# Patient Record
Sex: Female | Born: 1970 | Race: Black or African American | Marital: Single | Smoking: Never smoker
Health system: Southern US, Community
[De-identification: ages and names within clinical notes are randomized; demographics above are authoritative.]

## PROBLEM LIST (undated history)

## (undated) DIAGNOSIS — H409 Unspecified glaucoma: Secondary | ICD-10-CM

## (undated) DIAGNOSIS — E662 Morbid (severe) obesity with alveolar hypoventilation: Secondary | ICD-10-CM

## (undated) DIAGNOSIS — E039 Hypothyroidism, unspecified: Secondary | ICD-10-CM

## (undated) DIAGNOSIS — D869 Sarcoidosis, unspecified: Secondary | ICD-10-CM

## (undated) DIAGNOSIS — E232 Diabetes insipidus: Secondary | ICD-10-CM

## (undated) DIAGNOSIS — M810 Age-related osteoporosis without current pathological fracture: Secondary | ICD-10-CM

## (undated) DIAGNOSIS — I272 Pulmonary hypertension, unspecified: Secondary | ICD-10-CM

## (undated) DIAGNOSIS — I509 Heart failure, unspecified: Secondary | ICD-10-CM

## (undated) DIAGNOSIS — E230 Hypopituitarism: Secondary | ICD-10-CM

## (undated) DIAGNOSIS — G4733 Obstructive sleep apnea (adult) (pediatric): Secondary | ICD-10-CM

## (undated) DIAGNOSIS — K219 Gastro-esophageal reflux disease without esophagitis: Secondary | ICD-10-CM

## (undated) DIAGNOSIS — I10 Essential (primary) hypertension: Secondary | ICD-10-CM

## (undated) HISTORY — PX: ABDOMINAL HYSTERECTOMY: SHX81

## (undated) HISTORY — PX: BRAIN BIOPSY: SHX905

## (undated) HISTORY — PX: HERNIA REPAIR: SHX51

---

## 2019-04-05 ENCOUNTER — Encounter (HOSPITAL_COMMUNITY): Payer: Self-pay | Admitting: Family Medicine

## 2019-04-05 ENCOUNTER — Inpatient Hospital Stay (HOSPITAL_COMMUNITY)
Admission: AD | Admit: 2019-04-05 | Discharge: 2019-05-06 | DRG: 870 | Disposition: E | Payer: Medicaid Other | Source: Other Acute Inpatient Hospital | Attending: Internal Medicine | Admitting: Internal Medicine

## 2019-04-05 ENCOUNTER — Inpatient Hospital Stay (HOSPITAL_COMMUNITY): Payer: Medicaid Other

## 2019-04-05 DIAGNOSIS — R0602 Shortness of breath: Secondary | ICD-10-CM

## 2019-04-05 DIAGNOSIS — I2781 Cor pulmonale (chronic): Secondary | ICD-10-CM | POA: Diagnosis not present

## 2019-04-05 DIAGNOSIS — X58XXXA Exposure to other specified factors, initial encounter: Secondary | ICD-10-CM | POA: Diagnosis not present

## 2019-04-05 DIAGNOSIS — I5030 Unspecified diastolic (congestive) heart failure: Secondary | ICD-10-CM | POA: Diagnosis present

## 2019-04-05 DIAGNOSIS — R0902 Hypoxemia: Secondary | ICD-10-CM

## 2019-04-05 DIAGNOSIS — I110 Hypertensive heart disease with heart failure: Secondary | ICD-10-CM | POA: Diagnosis present

## 2019-04-05 DIAGNOSIS — L8990 Pressure ulcer of unspecified site, unspecified stage: Secondary | ICD-10-CM | POA: Insufficient documentation

## 2019-04-05 DIAGNOSIS — R6521 Severe sepsis with septic shock: Secondary | ICD-10-CM | POA: Diagnosis present

## 2019-04-05 DIAGNOSIS — Z809 Family history of malignant neoplasm, unspecified: Secondary | ICD-10-CM

## 2019-04-05 DIAGNOSIS — E039 Hypothyroidism, unspecified: Secondary | ICD-10-CM | POA: Diagnosis present

## 2019-04-05 DIAGNOSIS — R579 Shock, unspecified: Secondary | ICD-10-CM | POA: Diagnosis not present

## 2019-04-05 DIAGNOSIS — Z7952 Long term (current) use of systemic steroids: Secondary | ICD-10-CM

## 2019-04-05 DIAGNOSIS — Z825 Family history of asthma and other chronic lower respiratory diseases: Secondary | ICD-10-CM

## 2019-04-05 DIAGNOSIS — Z515 Encounter for palliative care: Secondary | ICD-10-CM | POA: Diagnosis not present

## 2019-04-05 DIAGNOSIS — D860 Sarcoidosis of lung: Secondary | ICD-10-CM | POA: Diagnosis present

## 2019-04-05 DIAGNOSIS — Z66 Do not resuscitate: Secondary | ICD-10-CM | POA: Diagnosis not present

## 2019-04-05 DIAGNOSIS — I272 Pulmonary hypertension, unspecified: Secondary | ICD-10-CM | POA: Diagnosis present

## 2019-04-05 DIAGNOSIS — J9611 Chronic respiratory failure with hypoxia: Secondary | ICD-10-CM | POA: Diagnosis present

## 2019-04-05 DIAGNOSIS — J9621 Acute and chronic respiratory failure with hypoxia: Secondary | ICD-10-CM | POA: Diagnosis not present

## 2019-04-05 DIAGNOSIS — Z0189 Encounter for other specified special examinations: Secondary | ICD-10-CM

## 2019-04-05 DIAGNOSIS — G931 Anoxic brain damage, not elsewhere classified: Secondary | ICD-10-CM | POA: Diagnosis present

## 2019-04-05 DIAGNOSIS — Z87891 Personal history of nicotine dependence: Secondary | ICD-10-CM

## 2019-04-05 DIAGNOSIS — J15212 Pneumonia due to Methicillin resistant Staphylococcus aureus: Secondary | ICD-10-CM | POA: Diagnosis present

## 2019-04-05 DIAGNOSIS — R001 Bradycardia, unspecified: Secondary | ICD-10-CM | POA: Diagnosis not present

## 2019-04-05 DIAGNOSIS — J1289 Other viral pneumonia: Secondary | ICD-10-CM | POA: Diagnosis present

## 2019-04-05 DIAGNOSIS — G92 Toxic encephalopathy: Secondary | ICD-10-CM | POA: Diagnosis present

## 2019-04-05 DIAGNOSIS — Z833 Family history of diabetes mellitus: Secondary | ICD-10-CM

## 2019-04-05 DIAGNOSIS — A4102 Sepsis due to Methicillin resistant Staphylococcus aureus: Principal | ICD-10-CM | POA: Diagnosis present

## 2019-04-05 DIAGNOSIS — U071 COVID-19: Secondary | ICD-10-CM | POA: Diagnosis present

## 2019-04-05 DIAGNOSIS — J988 Other specified respiratory disorders: Secondary | ICD-10-CM

## 2019-04-05 DIAGNOSIS — D8689 Sarcoidosis of other sites: Secondary | ICD-10-CM | POA: Diagnosis present

## 2019-04-05 DIAGNOSIS — Z90710 Acquired absence of both cervix and uterus: Secondary | ICD-10-CM

## 2019-04-05 DIAGNOSIS — J9601 Acute respiratory failure with hypoxia: Secondary | ICD-10-CM | POA: Diagnosis not present

## 2019-04-05 DIAGNOSIS — J690 Pneumonitis due to inhalation of food and vomit: Secondary | ICD-10-CM | POA: Diagnosis present

## 2019-04-05 DIAGNOSIS — E230 Hypopituitarism: Secondary | ICD-10-CM | POA: Diagnosis present

## 2019-04-05 DIAGNOSIS — E861 Hypovolemia: Secondary | ICD-10-CM | POA: Diagnosis not present

## 2019-04-05 DIAGNOSIS — N179 Acute kidney failure, unspecified: Secondary | ICD-10-CM | POA: Diagnosis present

## 2019-04-05 DIAGNOSIS — E870 Hyperosmolality and hypernatremia: Secondary | ICD-10-CM | POA: Diagnosis not present

## 2019-04-05 DIAGNOSIS — T17590A Other foreign object in bronchus causing asphyxiation, initial encounter: Secondary | ICD-10-CM | POA: Diagnosis not present

## 2019-04-05 DIAGNOSIS — Y92239 Unspecified place in hospital as the place of occurrence of the external cause: Secondary | ICD-10-CM | POA: Diagnosis not present

## 2019-04-05 DIAGNOSIS — Z4659 Encounter for fitting and adjustment of other gastrointestinal appliance and device: Secondary | ICD-10-CM

## 2019-04-05 DIAGNOSIS — M810 Age-related osteoporosis without current pathological fracture: Secondary | ICD-10-CM | POA: Diagnosis present

## 2019-04-05 DIAGNOSIS — D869 Sarcoidosis, unspecified: Secondary | ICD-10-CM

## 2019-04-05 DIAGNOSIS — R740 Nonspecific elevation of levels of transaminase and lactic acid dehydrogenase [LDH]: Secondary | ICD-10-CM | POA: Diagnosis not present

## 2019-04-05 DIAGNOSIS — I4510 Unspecified right bundle-branch block: Secondary | ICD-10-CM | POA: Diagnosis present

## 2019-04-05 DIAGNOSIS — H409 Unspecified glaucoma: Secondary | ICD-10-CM | POA: Diagnosis present

## 2019-04-05 DIAGNOSIS — Z6841 Body Mass Index (BMI) 40.0 and over, adult: Secondary | ICD-10-CM

## 2019-04-05 DIAGNOSIS — J80 Acute respiratory distress syndrome: Secondary | ICD-10-CM | POA: Diagnosis present

## 2019-04-05 DIAGNOSIS — Z9981 Dependence on supplemental oxygen: Secondary | ICD-10-CM

## 2019-04-05 DIAGNOSIS — E662 Morbid (severe) obesity with alveolar hypoventilation: Secondary | ICD-10-CM | POA: Diagnosis present

## 2019-04-05 DIAGNOSIS — B961 Klebsiella pneumoniae [K. pneumoniae] as the cause of diseases classified elsewhere: Secondary | ICD-10-CM | POA: Diagnosis present

## 2019-04-05 DIAGNOSIS — E119 Type 2 diabetes mellitus without complications: Secondary | ICD-10-CM | POA: Diagnosis present

## 2019-04-05 DIAGNOSIS — D649 Anemia, unspecified: Secondary | ICD-10-CM | POA: Diagnosis present

## 2019-04-05 DIAGNOSIS — N390 Urinary tract infection, site not specified: Secondary | ICD-10-CM | POA: Diagnosis present

## 2019-04-05 DIAGNOSIS — H5462 Unqualified visual loss, left eye, normal vision right eye: Secondary | ICD-10-CM | POA: Diagnosis present

## 2019-04-05 DIAGNOSIS — R7401 Elevation of levels of liver transaminase levels: Secondary | ICD-10-CM | POA: Diagnosis present

## 2019-04-05 DIAGNOSIS — E232 Diabetes insipidus: Secondary | ICD-10-CM | POA: Diagnosis not present

## 2019-04-05 DIAGNOSIS — K219 Gastro-esophageal reflux disease without esophagitis: Secondary | ICD-10-CM | POA: Diagnosis present

## 2019-04-05 DIAGNOSIS — R55 Syncope and collapse: Secondary | ICD-10-CM | POA: Diagnosis not present

## 2019-04-05 DIAGNOSIS — Z978 Presence of other specified devices: Secondary | ICD-10-CM

## 2019-04-05 HISTORY — DX: Diabetes insipidus: E23.2

## 2019-04-05 HISTORY — DX: Obstructive sleep apnea (adult) (pediatric): G47.33

## 2019-04-05 HISTORY — DX: Essential (primary) hypertension: I10

## 2019-04-05 HISTORY — DX: Pulmonary hypertension, unspecified: I27.20

## 2019-04-05 HISTORY — DX: Age-related osteoporosis without current pathological fracture: M81.0

## 2019-04-05 HISTORY — DX: Morbid (severe) obesity with alveolar hypoventilation: E66.2

## 2019-04-05 HISTORY — DX: Hypopituitarism: E23.0

## 2019-04-05 HISTORY — DX: Hypothyroidism, unspecified: E03.9

## 2019-04-05 HISTORY — DX: Sarcoidosis, unspecified: D86.9

## 2019-04-05 HISTORY — DX: Unspecified glaucoma: H40.9

## 2019-04-05 HISTORY — DX: Gastro-esophageal reflux disease without esophagitis: K21.9

## 2019-04-05 HISTORY — DX: Heart failure, unspecified: I50.9

## 2019-04-05 LAB — POCT I-STAT 7, (LYTES, BLD GAS, ICA,H+H)
Acid-Base Excess: 1 mmol/L (ref 0.0–2.0)
Bicarbonate: 29.6 mmol/L — ABNORMAL HIGH (ref 20.0–28.0)
Bicarbonate: 30.4 mmol/L — ABNORMAL HIGH (ref 20.0–28.0)
Calcium, Ion: 1.12 mmol/L — ABNORMAL LOW (ref 1.15–1.40)
Calcium, Ion: 1.13 mmol/L — ABNORMAL LOW (ref 1.15–1.40)
HCT: 36 % (ref 36.0–46.0)
HCT: 38 % (ref 36.0–46.0)
Hemoglobin: 12.2 g/dL (ref 12.0–15.0)
Hemoglobin: 12.9 g/dL (ref 12.0–15.0)
O2 Saturation: 92 %
O2 Saturation: 97 %
Patient temperature: 99
Patient temperature: 99.9
Potassium: 4.7 mmol/L (ref 3.5–5.1)
Potassium: 4.7 mmol/L (ref 3.5–5.1)
Sodium: 153 mmol/L — ABNORMAL HIGH (ref 135–145)
Sodium: 154 mmol/L — ABNORMAL HIGH (ref 135–145)
TCO2: 32 mmol/L (ref 22–32)
TCO2: 33 mmol/L — ABNORMAL HIGH (ref 22–32)
pCO2 arterial: 73 mmHg (ref 32.0–48.0)
pCO2 arterial: 73.8 mmHg (ref 32.0–48.0)
pH, Arterial: 7.212 — ABNORMAL LOW (ref 7.350–7.450)
pH, Arterial: 7.231 — ABNORMAL LOW (ref 7.350–7.450)
pO2, Arterial: 119 mmHg — ABNORMAL HIGH (ref 83.0–108.0)
pO2, Arterial: 81 mmHg — ABNORMAL LOW (ref 83.0–108.0)

## 2019-04-05 LAB — COMPREHENSIVE METABOLIC PANEL
ALT: 34 U/L (ref 0–44)
AST: 92 U/L — ABNORMAL HIGH (ref 15–41)
Albumin: 2.5 g/dL — ABNORMAL LOW (ref 3.5–5.0)
Alkaline Phosphatase: 65 U/L (ref 38–126)
Anion gap: 11 (ref 5–15)
BUN: 20 mg/dL (ref 6–20)
CO2: 26 mmol/L (ref 22–32)
Calcium: 7.6 mg/dL — ABNORMAL LOW (ref 8.9–10.3)
Chloride: 116 mmol/L — ABNORMAL HIGH (ref 98–111)
Creatinine, Ser: 1.47 mg/dL — ABNORMAL HIGH (ref 0.44–1.00)
GFR calc Af Amer: 48 mL/min — ABNORMAL LOW (ref >60)
GFR calc non Af Amer: 41 mL/min — ABNORMAL LOW (ref >60)
Glucose, Bld: 117 mg/dL — ABNORMAL HIGH (ref 70–99)
Potassium: 4.8 mmol/L (ref 3.5–5.1)
Sodium: 153 mmol/L — ABNORMAL HIGH (ref 135–145)
Total Bilirubin: 0.6 mg/dL (ref 0.3–1.2)
Total Protein: 7 g/dL (ref 6.5–8.1)

## 2019-04-05 LAB — CBC WITH DIFFERENTIAL/PLATELET
Abs Immature Granulocytes: 0.09 10*3/uL — ABNORMAL HIGH (ref 0.00–0.07)
Basophils Absolute: 0 10*3/uL (ref 0.0–0.1)
Basophils Relative: 0 %
Eosinophils Absolute: 0 10*3/uL (ref 0.0–0.5)
Eosinophils Relative: 0 %
HCT: 44.3 % (ref 36.0–46.0)
Hemoglobin: 12.4 g/dL (ref 12.0–15.0)
Immature Granulocytes: 1 %
Lymphocytes Relative: 5 %
Lymphs Abs: 0.9 10*3/uL (ref 0.7–4.0)
MCH: 26.3 pg (ref 26.0–34.0)
MCHC: 28 g/dL — ABNORMAL LOW (ref 30.0–36.0)
MCV: 94.1 fL (ref 80.0–100.0)
Monocytes Absolute: 1.2 10*3/uL — ABNORMAL HIGH (ref 0.1–1.0)
Monocytes Relative: 7 %
Neutro Abs: 14.8 10*3/uL — ABNORMAL HIGH (ref 1.7–7.7)
Neutrophils Relative %: 87 %
Platelets: 191 10*3/uL (ref 150–400)
RBC: 4.71 MIL/uL (ref 3.87–5.11)
RDW: 17.2 % — ABNORMAL HIGH (ref 11.5–15.5)
WBC: 16.9 10*3/uL — ABNORMAL HIGH (ref 4.0–10.5)
nRBC: 0.8 % — ABNORMAL HIGH (ref 0.0–0.2)

## 2019-04-05 LAB — LACTIC ACID, PLASMA
Lactic Acid, Venous: 1.4 mmol/L (ref 0.5–1.9)
Lactic Acid, Venous: 1.2 mmol/L (ref 0.5–1.9)

## 2019-04-05 LAB — GLUCOSE, CAPILLARY
Glucose-Capillary: 105 mg/dL — ABNORMAL HIGH (ref 70–99)
Glucose-Capillary: 112 mg/dL — ABNORMAL HIGH (ref 70–99)

## 2019-04-05 LAB — FERRITIN: Ferritin: 334 ng/mL — ABNORMAL HIGH (ref 11–307)

## 2019-04-05 LAB — MAGNESIUM: Magnesium: 1.7 mg/dL (ref 1.7–2.4)

## 2019-04-05 LAB — C-REACTIVE PROTEIN: CRP: 35.4 mg/dL — ABNORMAL HIGH (ref <1.0)

## 2019-04-05 MED ORDER — NOREPINEPHRINE 4 MG/250ML-% IV SOLN
INTRAVENOUS | Status: AC
  Filled 2019-04-05: qty 250

## 2019-04-05 MED ORDER — ENOXAPARIN SODIUM 30 MG/0.3ML ~~LOC~~ SOLN: 30.0000 mg | SUBCUTANEOUS | Status: AC

## 2019-04-05 MED ORDER — MIDAZOLAM BOLUS VIA INFUSION
1.0000 mg | INTRAVENOUS | Status: DC | PRN
  Filled 2019-04-05: qty 2

## 2019-04-05 MED ORDER — ENOXAPARIN SODIUM 40 MG/0.4ML ~~LOC~~ SOLN
40.0000 mg | Freq: Two times a day (BID) | SUBCUTANEOUS | Status: DC
  Administered 2019-04-05: 40 mg via SUBCUTANEOUS
  Filled 2019-04-05: qty 0.4

## 2019-04-05 MED ORDER — METRONIDAZOLE IN NACL 5-0.79 MG/ML-% IV SOLN
500.0000 mg | Freq: Three times a day (TID) | INTRAVENOUS | Status: DC
  Administered 2019-04-05 – 2019-04-08 (×8): 500 mg via INTRAVENOUS
  Filled 2019-04-05 (×9): qty 100

## 2019-04-05 MED ORDER — MIDAZOLAM HCL 2 MG/2ML IJ SOLN
INTRAMUSCULAR | Status: AC
  Administered 2019-04-05: 2 mg
  Filled 2019-04-05: qty 2

## 2019-04-05 MED ORDER — ENOXAPARIN SODIUM 80 MG/0.8ML ~~LOC~~ SOLN
70.0000 mg | Freq: Two times a day (BID) | SUBCUTANEOUS | Status: DC
  Administered 2019-04-06 – 2019-04-08 (×5): 70 mg via SUBCUTANEOUS
  Filled 2019-04-05 (×5): qty 0.8

## 2019-04-05 MED ORDER — VITAMIN C 500 MG PO TABS
500.0000 mg | ORAL_TABLET | Freq: Every day | ORAL | Status: DC
  Administered 2019-04-05 – 2019-04-13 (×9): 500 mg
  Filled 2019-04-05 (×11): qty 1

## 2019-04-05 MED ORDER — ZINC SULFATE 220 (50 ZN) MG PO CAPS
220.0000 mg | ORAL_CAPSULE | Freq: Every day | ORAL | Status: DC
  Administered 2019-04-05 – 2019-04-13 (×9): 220 mg
  Filled 2019-04-05 (×10): qty 1

## 2019-04-05 MED ORDER — FENTANYL CITRATE (PF) 100 MCG/2ML IJ SOLN
INTRAMUSCULAR | Status: AC
  Filled 2019-04-05: qty 2

## 2019-04-05 MED ORDER — MIDAZOLAM HCL 2 MG/2ML IJ SOLN
1.0000 mg | INTRAMUSCULAR | Status: DC | PRN
  Administered 2019-04-05 – 2019-04-11 (×7): 2 mg via INTRAVENOUS
  Filled 2019-04-05 (×6): qty 2

## 2019-04-05 MED ORDER — DESMOPRESSIN ACETATE 0.2 MG PO TABS: 0.2000 mg | ORAL_TABLET | Freq: Every day | ORAL | Status: DC

## 2019-04-05 MED ORDER — SODIUM CHLORIDE 0.9 % IV SOLN
250.0000 mL | INTRAVENOUS | Status: DC
  Administered 2019-04-05 – 2019-04-07 (×3): 250 mL via INTRAVENOUS

## 2019-04-05 MED ORDER — CHLORHEXIDINE GLUCONATE CLOTH 2 % EX PADS
6.0000 | MEDICATED_PAD | Freq: Every day | CUTANEOUS | Status: DC
  Administered 2019-04-06 – 2019-04-07 (×2): 6 via TOPICAL

## 2019-04-05 MED ORDER — MIDAZOLAM 50MG/50ML (1MG/ML) PREMIX INFUSION
0.0000 mg/h | INTRAVENOUS | Status: DC
  Administered 2019-04-06 – 2019-04-07 (×2): 2 mg/h via INTRAVENOUS
  Administered 2019-04-07 – 2019-04-08 (×4): 4 mg/h via INTRAVENOUS
  Administered 2019-04-09 – 2019-04-11 (×3): 2 mg/h via INTRAVENOUS
  Administered 2019-04-12: 19:00:00 4 mg/h via INTRAVENOUS
  Administered 2019-04-13: 6 mg/h via INTRAVENOUS
  Filled 2019-04-05 (×12): qty 50

## 2019-04-05 MED ORDER — NOREPINEPHRINE 4 MG/250ML-% IV SOLN
0.0000 ug/min | INTRAVENOUS | Status: DC
  Administered 2019-04-05: 16 ug/min via INTRAVENOUS
  Administered 2019-04-06: 2 ug/min via INTRAVENOUS
  Administered 2019-04-06: 3 ug/min via INTRAVENOUS
  Administered 2019-04-06: 8 ug/min via INTRAVENOUS
  Administered 2019-04-10: 5 ug/min via INTRAVENOUS
  Filled 2019-04-05 (×4): qty 250

## 2019-04-05 MED ORDER — VANCOMYCIN HCL 10 G IV SOLR
2500.0000 mg | Freq: Once | INTRAVENOUS | Status: DC
  Filled 2019-04-05: qty 2500

## 2019-04-05 MED ORDER — SODIUM CHLORIDE 0.9 % IV SOLN
200.0000 mg | Freq: Once | INTRAVENOUS | Status: AC
  Administered 2019-04-05: 200 mg via INTRAVENOUS
  Filled 2019-04-05: qty 40

## 2019-04-05 MED ORDER — LEVOTHYROXINE SODIUM 125 MCG PO TABS
125.0000 ug | ORAL_TABLET | Freq: Every day | ORAL | Status: DC
  Administered 2019-04-06 – 2019-04-13 (×7): 125 ug
  Filled 2019-04-05 (×9): qty 1

## 2019-04-05 MED ORDER — SODIUM CHLORIDE 0.9 % IV SOLN
100.0000 mg | INTRAVENOUS | Status: AC
  Administered 2019-04-06 – 2019-04-09 (×4): 100 mg via INTRAVENOUS
  Filled 2019-04-05 (×4): qty 20

## 2019-04-05 MED ORDER — FAMOTIDINE 40 MG/5ML PO SUSR
20.0000 mg | Freq: Two times a day (BID) | ORAL | Status: DC
  Administered 2019-04-05 – 2019-04-13 (×16): 20 mg
  Filled 2019-04-05 (×16): qty 2.5

## 2019-04-05 MED ORDER — GUAIFENESIN-DM 100-10 MG/5ML PO SYRP
10.0000 mL | ORAL_SOLUTION | ORAL | Status: DC | PRN
  Administered 2019-04-06: 10 mL
  Filled 2019-04-05: qty 10

## 2019-04-05 MED ORDER — CHLORHEXIDINE GLUCONATE 0.12% ORAL RINSE (MEDLINE KIT)
15.0000 mL | Freq: Two times a day (BID) | OROMUCOSAL | Status: DC
  Administered 2019-04-06 – 2019-04-13 (×15): 15 mL via OROMUCOSAL

## 2019-04-05 MED ORDER — VASOPRESSIN 20 UNIT/ML IV SOLN
0.0300 [IU]/min | INTRAVENOUS | Status: DC
  Administered 2019-04-05 – 2019-04-07 (×3): 0.03 [IU]/min via INTRAVENOUS
  Filled 2019-04-05 (×4): qty 2

## 2019-04-05 MED ORDER — VANCOMYCIN HCL 10 G IV SOLR
1250.0000 mg | INTRAVENOUS | Status: DC
  Administered 2019-04-06: 1250 mg via INTRAVENOUS
  Filled 2019-04-05 (×2): qty 1250
  Filled 2019-04-05: qty 1000

## 2019-04-05 MED ORDER — SODIUM CHLORIDE 0.9 % IV SOLN
2.0000 g | Freq: Three times a day (TID) | INTRAVENOUS | Status: DC
  Administered 2019-04-06 – 2019-04-09 (×10): 2 g via INTRAVENOUS
  Filled 2019-04-05 (×10): qty 2

## 2019-04-05 MED ORDER — ORAL CARE MOUTH RINSE
15.0000 mL | OROMUCOSAL | Status: DC
  Administered 2019-04-05 – 2019-04-13 (×75): 15 mL via OROMUCOSAL

## 2019-04-05 MED ORDER — HYDROCOD POLST-CPM POLST ER 10-8 MG/5ML PO SUER: 5.0000 mL | Freq: Two times a day (BID) | ORAL | Status: DC | PRN

## 2019-04-05 MED ORDER — INSULIN ASPART 100 UNIT/ML ~~LOC~~ SOLN
0.0000 [IU] | Freq: Three times a day (TID) | SUBCUTANEOUS | Status: DC
  Administered 2019-04-06: 1 [IU] via SUBCUTANEOUS
  Administered 2019-04-06: 2 [IU] via SUBCUTANEOUS

## 2019-04-05 MED ORDER — HYDROCORTISONE NA SUCCINATE PF 100 MG IJ SOLR
50.0000 mg | Freq: Four times a day (QID) | INTRAMUSCULAR | Status: DC
  Administered 2019-04-05 – 2019-04-06 (×2): 50 mg via INTRAVENOUS
  Filled 2019-04-05 (×2): qty 2

## 2019-04-05 MED ORDER — SODIUM CHLORIDE 0.9 % IV SOLN
2.0000 g | Freq: Once | INTRAVENOUS | Status: AC
  Administered 2019-04-05: 2 g via INTRAVENOUS
  Filled 2019-04-05: qty 2

## 2019-04-05 NOTE — Progress Notes (Signed)
Sister called, updated on patient condition. Patient on no sedation at this time but appears to be comfortable. Sister emotional but very appreciative of care.

## 2019-04-05 NOTE — Progress Notes (Signed)
Critical ABG results called to [place]. Increased rate to 35. RT will await any further changes,.

## 2019-04-05 NOTE — Progress Notes (Signed)
Patient arrived at shift change via transport on PRVC 450-25-100% +20.  Placed on 8cc/kg of 360-26-100% +16. Spo2 at 95%.  MD at bedside. ABG in one hour.  8.0 ETT secured at 22 cm @lip .  ABG in left radial.  Currently unable to draw blood from line. Rt will assess.

## 2019-04-05 NOTE — Progress Notes (Signed)
Pharmacy Antibiotic Note  Christine Patterson is a 48 y.o. female admitted on 05/02/2019 after being found unresponsive with sepsis, COVID-19. CTA chest shows bilateral atypical PNA so started broad spectrum abx. Pt required intubation. Pharmacy has been consulted for Vancomycin and Cefepime dosing.  Vanc 2g given at OSH at 15:35  Wt 140.5 kg from 6/29, SCr 1.47, est CrCl > 60 ml/min ALT 34  Plan: Remdesivir 200 mg IV now then 100 mg q24h x 4 days Cefepime 2gm IV q8h Vancomycin 1250 mg IV Q 24 hrs. Will start first maintenance dose a bit early as pt did not receive full load. Goal AUC 400-550. Expected AUC: 516 SCr used: 1.47, Vd coeff: 0.5 Will f/u renal function, micro data, and pt's clinical condition Vanc levels prn  Height: 5' (152.4 cm) IBW/kg (Calculated) : 45.5  No data recorded.  Recent Labs  Lab 04/11/2019 2015  WBC 16.9*  CREATININE 1.47*  LATICACIDVEN 1.4    CrCl cannot be calculated (Unknown ideal weight.).    No Known Allergies  Antimicrobials this admission: 7/1 Remdesivir >> 7/5 7/1 Vanc >> 7/1 Cefepime >> 7/1 Flagyl >>  Dose adjustments this admission: N/A  Microbiology results: Pending  Thank you for allowing pharmacy to be a part of this patient's care.  Sherlon Handing, PharmD, BCPS Clinical pharmacist 04/08/2019 10:08 PM

## 2019-04-05 NOTE — Progress Notes (Addendum)
Patient arrived to facility and transferred to bed. On nonepinephrine, vasopressin, fentanyl and versed drips u[pon arrival. Pt sedation turned off, still unresponsive. L pupil larger that right hx of glaucoma & blindness. [facility] has seem pt at this time, Elink aware of pt arrival. Foley exchanged per MD verbal order.

## 2019-04-05 NOTE — Progress Notes (Signed)
[place] [facility] Hospitalist notified of pt's arrival.

## 2019-04-05 NOTE — Progress Notes (Addendum)
[place] Progress Note Patient Name: Christine Patterson DOB: 1971/02/11 MRN: [id number]   Date of Service  Apr 30, 2019  HPI/Events of Note  Hypercapnia on ABG  eICU Interventions  Respiratory rate increased to 32 from 26, ABG in 1 hour. Wean FIO2.     Intervention Category Major Interventions: Respiratory failure - evaluation and management  Frederik Pear Apr 30, 2019, 9:47 PM

## 2019-04-05 NOTE — Progress Notes (Signed)
Pt arrived to [facility]-ICU, placed in room/bed 207-2.

## 2019-04-05 NOTE — Progress Notes (Signed)
PMH in EHR updated with information brought over from [facility].

## 2019-04-05 NOTE — H&P (Addendum)
History and Physical  Patient Name: Christine Patterson     BHA:[id number]    DOB: [birth year]    DOA: 05/04/2019 PCP: System, Pcp Not In  Patient coming from: [facility], [place]  Chief Complaint: Found unresponsive      HPI: Komal Stangelo is a 48 y.o. 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  Caveat that patient is intubated and sedated and all history is collected from outside chart review.   Per [place] records, a neighbor went to check on the patient, found her lethargic, unresponsive.  EMS described her breathing as "agonal" and O2 saturations at 50%.    She was brought to the ER where her O2 sat stayed in 80s on NRB and so she was intubated (reportedly a tooth was dislodged in intubation).  She had hypotension post-intubation, and so was started on levophed and vasopressin.  CTA chest showed no PE but bilateral atypical pneumonia.  SARS-CoV-2 PCR +.    She had lactate 5.1, pH 7.3 pCO2 59, pO2 41 on 100% FiO2.  Troponin 2x ULN.  Procal elevated.  WBC 22K.  She was started on vancomycin and Zosyn.  There was no ICU bed availability at [facility] or [place] and was accepted to the ICU at [facility] for [place] with septic shock.        ROS: Review of Systems  Unable to perform ROS: Intubated          Past Medical History:  Diagnosis Date  . CHF (congestive heart failure) ([place])   . GERD (gastroesophageal reflux disease)   . Hypothyroidism   . Panhypopituitarism ([place])   . Sarcoidosis     Past Surgical History:  Procedure Laterality Date  . ABDOMINAL HYSTERECTOMY    . BRAIN BIOPSY      Social History: Patient lives alone.  The patient walks unassisted.  Nonsmoker.  All collected via chart due to patient intubated.  No Known Allergies per outside  records  Family history: family history includes COPD in her mother; Cancer in her mother; Diabetes in her sister.  Prior to Admission medications   Prednisone 7.5 mg daily Spironolactone 25 mg daily Levothyroxine 125 mcg daily Fosamax DDAVP 0.2 mg daily Gabapentin 300 mg TID Atorvastatin 40 mg nightly Omeprazole 20 mg daily        Physical Exam: BP 132/68   Pulse 86   Resp (!) 25   Ht 5' (1.524 m)   SpO2 96%  General appearance: Obese adult female, intubated.  Unresponsive. Eyes: Anicteric, conjunctiva pink, lids and lashes normal.  Right pupil 2 mm, left pupil 69mm.  No reactions. ENT: No nasal deformity, discharge, epistaxis, NG in place. Lips dry. ETT in place.     Neck: No neck masses.  Trachea midline.  No thyromegaly/tenderness. Skin: Warm and dry.  No suspicious rashes or lesions. Cardiac: RRR, nl S1-S2, no murmurs appreciated.  Capillary refill is brisk.  JVP not visible.  No LE edema.  Radial pulses 2+ and symmetric. Respiratory: Normal respiratory rate and rhythm on ventilator.  CTAB without rales or wheezes. Abdomen: Abdomen soft.  No grimace to palpation.  No ascites, distension, hepatosplenomegaly.   MSK: No deformities or effusions of the large joints of the upper or lower extremities bilaterally.  No cyanosis or clubbing. Neuro: Intubated.  No  sedation currently, was recently on midazolam and fentanyl infusions.  Left foot withdraws from pain, no withdrawal from noxious stimuli in other extremities.   No posturing.  Psych: Unable to assess.     Labs on Admission:  I have personally reviewed the labs outlined in the HPI above.      Radiological Exams on Admission: Personally reviewed CTA chest report, summarized above.  EKG: Independently reviewed. Rate 84, RBBB, left axis, no ST changes.          Assessment/Plan   Coronavirus pneumonitis with acute on chronic hypoxic and hypercapnic respiratory failure ARDS In setting of ongoing 2020  COVID-19 pandemic.  Remdesivir start 7/1 Hold Actemra in light of sepsis  -Start Remdesivir, day 1 of 5 -Start steroids  -VTE PPx with Lovenox -Continue Zinc and Vitamin C -Daily d-dimer, ferritin and CRP   -Repeat ABG  Ventilator best practice:  Diet: Nutrition consulted VAP protocol (if indicated): In place GI prophylaxis: Famotidine Glucose control: BID Mobility: PT ordered    Septic shock, unclear source Lactic acid 5.1 on arrival, improved to 2.1 at OSH.  Cultures of blood at OSH pending. -Follow blood cultures at OSH -Continue Levophed and vasopressin -Continue vancomycin -Replace Zosyn with Cefepime/Flagyl -Obtain CBC, CMP, lactic acid  Panhypopituitarism Doubt PCP pneumonia. -Continue levothyroxine -Hold DDAVP --> RESTART when vasopressin gtt stopped  Hypernatremia Due to panhypopit.  D/w RPh, vasopressin dose exceeds her normal daily DDAVP dose -Hold DDAVP -Continue vasopressin -Start fliuds, monitor Na closely  Neurosarcoidosis -Hold home prednisone -Hold Bactrim ppx -Steroids as above  Acute kidney injury Creatinine basleine 0.9.  At OSH, Cr up to 1.4 in last 24 hours. -Strict I/Os -Conservative fluid management in light of ARDS  Other medicaitons -Continue omeprazole as H2RA  -Hold gabapentin for now -Hold statin for now   Transaminitis From COVID-19  Unequal pupils Neurological exam compromised by sedation.  Pupils unequal, unknown history.  -Obtain CT head     Code Status: FULL, discussed with daughter by phone  Family Communication: daughter  Disposition Plan: Admit to ICU, stabilize and obtain CT head.  Overall very poor prognosis. Consults called: E-link Admission status: INPATIENT     Medical decision making: Patient seen at 8:52 PM on 04/19/2019.  The patient was discussed with Dr. Warrick Parisiangan.  What exists of the patient's chart was reviewed in depth and outside records were obtained and summarized above.  Clinical condition:  unstable, prognosis poor.      The patient is critically ill with multi-organ failure.  Critical care was necessary to treat or prevent imminent or life-threatening deterioration of sepsis, respiratory failure, renal failure and was exclusive of separately billable procedures and treating other patients. Total critical care time spent by me: 55 minutes Time spent personally by me on obtaining history from patient or surrogate, evaluation of the patient, evaluation of patient's response to treatment, ordering and review of laboratory studies, ordering and review of radiographic studies, ordering and performing treatments and interventions, and re-evaluation of the patient's condition.      Earl Liteshristopher P Mickel Schreur [facility] Pager: please page via [email or web address]

## 2019-04-06 ENCOUNTER — Inpatient Hospital Stay (HOSPITAL_COMMUNITY): Payer: Medicaid Other

## 2019-04-06 DIAGNOSIS — L8990 Pressure ulcer of unspecified site, unspecified stage: Secondary | ICD-10-CM | POA: Insufficient documentation

## 2019-04-06 DIAGNOSIS — R55 Syncope and collapse: Secondary | ICD-10-CM

## 2019-04-06 DIAGNOSIS — J9621 Acute and chronic respiratory failure with hypoxia: Secondary | ICD-10-CM

## 2019-04-06 LAB — MAGNESIUM
Magnesium: 1.9 mg/dL (ref 1.7–2.4)
Magnesium: 2.2 mg/dL (ref 1.7–2.4)
Magnesium: 2.1 mg/dL (ref 1.7–2.4)

## 2019-04-06 LAB — POCT I-STAT 7, (LYTES, BLD GAS, ICA,H+H)
Acid-Base Excess: 1 mmol/L (ref 0.0–2.0)
Acid-Base Excess: 1 mmol/L (ref 0.0–2.0)
Bicarbonate: 30.5 mmol/L — ABNORMAL HIGH (ref 20.0–28.0)
Bicarbonate: 27.2 mmol/L (ref 20.0–28.0)
Bicarbonate: 28.9 mmol/L — ABNORMAL HIGH (ref 20.0–28.0)
Calcium, Ion: 1.12 mmol/L — ABNORMAL LOW (ref 1.15–1.40)
Calcium, Ion: 1.1 mmol/L — ABNORMAL LOW (ref 1.15–1.40)
Calcium, Ion: 1.14 mmol/L — ABNORMAL LOW (ref 1.15–1.40)
HCT: 39 % (ref 36.0–46.0)
HCT: 47 % — ABNORMAL HIGH (ref 36.0–46.0)
HCT: 34 % — ABNORMAL LOW (ref 36.0–46.0)
Hemoglobin: 13.3 g/dL (ref 12.0–15.0)
Hemoglobin: 16 g/dL — ABNORMAL HIGH (ref 12.0–15.0)
Hemoglobin: 11.6 g/dL — ABNORMAL LOW (ref 12.0–15.0)
O2 Saturation: 92 %
O2 Saturation: 94 %
O2 Saturation: 95 %
Patient temperature: 98.9
Patient temperature: 98.5
Patient temperature: 98.6
Potassium: 4.6 mmol/L (ref 3.5–5.1)
Potassium: 4.5 mmol/L (ref 3.5–5.1)
Potassium: 4.3 mmol/L (ref 3.5–5.1)
Sodium: 154 mmol/L — ABNORMAL HIGH (ref 135–145)
Sodium: 155 mmol/L — ABNORMAL HIGH (ref 135–145)
Sodium: 151 mmol/L — ABNORMAL HIGH (ref 135–145)
TCO2: 33 mmol/L — ABNORMAL HIGH (ref 22–32)
TCO2: 29 mmol/L (ref 22–32)
TCO2: 31 mmol/L (ref 22–32)
pCO2 arterial: 71.3 mmHg (ref 32.0–48.0)
pCO2 arterial: 54.7 mmHg — ABNORMAL HIGH (ref 32.0–48.0)
pCO2 arterial: 64.8 mmHg — ABNORMAL HIGH (ref 32.0–48.0)
pH, Arterial: 7.24 — ABNORMAL LOW (ref 7.350–7.450)
pH, Arterial: 7.305 — ABNORMAL LOW (ref 7.350–7.450)
pH, Arterial: 7.257 — ABNORMAL LOW (ref 7.350–7.450)
pO2, Arterial: 79 mmHg — ABNORMAL LOW (ref 83.0–108.0)
pO2, Arterial: 77 mmHg — ABNORMAL LOW (ref 83.0–108.0)
pO2, Arterial: 90 mmHg (ref 83.0–108.0)

## 2019-04-06 LAB — COMPREHENSIVE METABOLIC PANEL
ALT: 35 U/L (ref 0–44)
AST: 100 U/L — ABNORMAL HIGH (ref 15–41)
Albumin: 2.4 g/dL — ABNORMAL LOW (ref 3.5–5.0)
Alkaline Phosphatase: 58 U/L (ref 38–126)
Anion gap: 8 (ref 5–15)
BUN: 20 mg/dL (ref 6–20)
CO2: 30 mmol/L (ref 22–32)
Calcium: 7.6 mg/dL — ABNORMAL LOW (ref 8.9–10.3)
Chloride: 116 mmol/L — ABNORMAL HIGH (ref 98–111)
Creatinine, Ser: 1.2 mg/dL — ABNORMAL HIGH (ref 0.44–1.00)
GFR calc Af Amer: >60 mL/min (ref >60)
GFR calc non Af Amer: 53 mL/min — ABNORMAL LOW (ref >60)
Glucose, Bld: 127 mg/dL — ABNORMAL HIGH (ref 70–99)
Potassium: 4.5 mmol/L (ref 3.5–5.1)
Sodium: 154 mmol/L — ABNORMAL HIGH (ref 135–145)
Total Bilirubin: 0.4 mg/dL (ref 0.3–1.2)
Total Protein: 6.8 g/dL (ref 6.5–8.1)

## 2019-04-06 LAB — GLUCOSE, CAPILLARY
Glucose-Capillary: 119 mg/dL — ABNORMAL HIGH (ref 70–99)
Glucose-Capillary: 126 mg/dL — ABNORMAL HIGH (ref 70–99)
Glucose-Capillary: 168 mg/dL — ABNORMAL HIGH (ref 70–99)
Glucose-Capillary: 172 mg/dL — ABNORMAL HIGH (ref 70–99)
Glucose-Capillary: 174 mg/dL — ABNORMAL HIGH (ref 70–99)
Glucose-Capillary: 186 mg/dL — ABNORMAL HIGH (ref 70–99)

## 2019-04-06 LAB — FERRITIN: Ferritin: 299 ng/mL (ref 11–307)

## 2019-04-06 LAB — CBC WITH DIFFERENTIAL/PLATELET
Abs Immature Granulocytes: 0.14 10*3/uL — ABNORMAL HIGH (ref 0.00–0.07)
Basophils Absolute: 0 10*3/uL (ref 0.0–0.1)
Basophils Relative: 0 %
Eosinophils Absolute: 0 10*3/uL (ref 0.0–0.5)
Eosinophils Relative: 0 %
HCT: 42.5 % (ref 36.0–46.0)
Hemoglobin: 11.4 g/dL — ABNORMAL LOW (ref 12.0–15.0)
Immature Granulocytes: 1 %
Lymphocytes Relative: 6 %
Lymphs Abs: 0.8 10*3/uL (ref 0.7–4.0)
MCH: 25.4 pg — ABNORMAL LOW (ref 26.0–34.0)
MCHC: 26.8 g/dL — ABNORMAL LOW (ref 30.0–36.0)
MCV: 94.7 fL (ref 80.0–100.0)
Monocytes Absolute: 1.1 10*3/uL — ABNORMAL HIGH (ref 0.1–1.0)
Monocytes Relative: 7 %
Neutro Abs: 12.6 10*3/uL — ABNORMAL HIGH (ref 1.7–7.7)
Neutrophils Relative %: 86 %
Platelets: 192 10*3/uL (ref 150–400)
RBC: 4.49 MIL/uL (ref 3.87–5.11)
RDW: 17.3 % — ABNORMAL HIGH (ref 11.5–15.5)
WBC: 14.7 10*3/uL — ABNORMAL HIGH (ref 4.0–10.5)
nRBC: 0.5 % — ABNORMAL HIGH (ref 0.0–0.2)

## 2019-04-06 LAB — PROCALCITONIN: Procalcitonin: 3.2 ng/mL

## 2019-04-06 LAB — PHOSPHORUS
Phosphorus: 2.2 mg/dL — ABNORMAL LOW (ref 2.5–4.6)
Phosphorus: 1.9 mg/dL — ABNORMAL LOW (ref 2.5–4.6)

## 2019-04-06 LAB — TROPONIN I (HIGH SENSITIVITY)
Troponin I (High Sensitivity): 30 ng/L — ABNORMAL HIGH (ref <18)
Troponin I (High Sensitivity): 26 ng/L — ABNORMAL HIGH (ref <18)

## 2019-04-06 LAB — D-DIMER, QUANTITATIVE
D-Dimer, Quant: 4.49 ug/mL-FEU — ABNORMAL HIGH (ref 0.00–0.50)
D-Dimer, Quant: 4.47 ug/mL-FEU — ABNORMAL HIGH (ref 0.00–0.50)

## 2019-04-06 LAB — C-REACTIVE PROTEIN: CRP: 33.6 mg/dL — ABNORMAL HIGH (ref <1.0)

## 2019-04-06 LAB — MRSA PCR SCREENING: MRSA by PCR: POSITIVE — AB

## 2019-04-06 LAB — PROTIME-INR
INR: 1.2 (ref 0.8–1.2)
Prothrombin Time: 14.9 seconds (ref 11.4–15.2)

## 2019-04-06 LAB — HIV ANTIBODY (ROUTINE TESTING W REFLEX): HIV Screen 4th Generation wRfx: NONREACTIVE

## 2019-04-06 LAB — APTT: aPTT: 36 seconds (ref 24–36)

## 2019-04-06 MED ORDER — ATORVASTATIN CALCIUM 40 MG PO TABS
40.0000 mg | ORAL_TABLET | Freq: Every day | ORAL | Status: DC
  Administered 2019-04-06 – 2019-04-12 (×7): 40 mg
  Filled 2019-04-06 (×7): qty 1

## 2019-04-06 MED ORDER — VITAL HIGH PROTEIN PO LIQD
1000.0000 mL | ORAL | Status: DC
  Administered 2019-04-06: 1000 mL

## 2019-04-06 MED ORDER — INSULIN ASPART 100 UNIT/ML ~~LOC~~ SOLN
0.0000 [IU] | SUBCUTANEOUS | Status: DC
  Administered 2019-04-06 (×2): 2 [IU] via SUBCUTANEOUS
  Administered 2019-04-07 (×2): 1 [IU] via SUBCUTANEOUS
  Administered 2019-04-07: 2 [IU] via SUBCUTANEOUS
  Administered 2019-04-07 – 2019-04-09 (×7): 1 [IU] via SUBCUTANEOUS
  Administered 2019-04-10: 2 [IU] via SUBCUTANEOUS
  Administered 2019-04-10 – 2019-04-13 (×7): 1 [IU] via SUBCUTANEOUS

## 2019-04-06 MED ORDER — FENTANYL BOLUS VIA INFUSION
50.0000 ug | INTRAVENOUS | Status: DC | PRN
  Filled 2019-04-06: qty 50

## 2019-04-06 MED ORDER — FENTANYL 2500MCG IN NS 250ML (10MCG/ML) PREMIX INFUSION
50.0000 ug/h | INTRAVENOUS | Status: DC
  Administered 2019-04-06: 50 ug/h via INTRAVENOUS
  Administered 2019-04-07: 08:00:00 125 ug/h via INTRAVENOUS
  Administered 2019-04-07: 200 ug/h via INTRAVENOUS
  Administered 2019-04-08: 150 ug/h via INTRAVENOUS
  Administered 2019-04-08: 200 ug/h via INTRAVENOUS
  Administered 2019-04-09: 125 ug/h via INTRAVENOUS
  Administered 2019-04-10: 150 ug/h via INTRAVENOUS
  Filled 2019-04-06 (×7): qty 250

## 2019-04-06 MED ORDER — MUPIROCIN 2 % EX OINT
1.0000 "application " | TOPICAL_OINTMENT | Freq: Two times a day (BID) | CUTANEOUS | Status: AC
  Administered 2019-04-06 – 2019-04-11 (×10): 1 via NASAL
  Filled 2019-04-06: qty 22

## 2019-04-06 MED ORDER — VECURONIUM BROMIDE 10 MG IV SOLR
INTRAVENOUS | Status: AC
  Administered 2019-04-06: 10 mg via INTRAVENOUS
  Filled 2019-04-06: qty 10

## 2019-04-06 MED ORDER — HYDROCORTISONE NA SUCCINATE PF 100 MG IJ SOLR
100.0000 mg | Freq: Four times a day (QID) | INTRAMUSCULAR | Status: DC
  Administered 2019-04-06 – 2019-04-08 (×9): 100 mg via INTRAVENOUS
  Filled 2019-04-06 (×10): qty 2

## 2019-04-06 MED ORDER — PRO-STAT SUGAR FREE PO LIQD
30.0000 mL | Freq: Every day | ORAL | Status: DC
  Administered 2019-04-06 – 2019-04-10 (×5): 30 mL
  Filled 2019-04-06 (×4): qty 30

## 2019-04-06 MED ORDER — MIDAZOLAM HCL 2 MG/2ML IJ SOLN
2.0000 mg | Freq: Once | INTRAMUSCULAR | Status: AC
  Administered 2019-04-06: 2 mg via INTRAVENOUS

## 2019-04-06 MED ORDER — DESMOPRESSIN ACETATE 0.1 MG PO TABS: 0.1000 mg | ORAL_TABLET | Freq: Two times a day (BID) | ORAL | Status: DC

## 2019-04-06 MED ORDER — DESMOPRESSIN ACETATE 4 MCG/ML IJ SOLN
2.0000 ug | Freq: Two times a day (BID) | INTRAMUSCULAR | Status: DC
  Filled 2019-04-06 (×3): qty 1

## 2019-04-06 MED ORDER — VECURONIUM BROMIDE 10 MG IV SOLR
10.0000 mg | INTRAVENOUS | Status: DC | PRN
  Administered 2019-04-08: 10 mg via INTRAVENOUS
  Filled 2019-04-06: qty 10

## 2019-04-06 MED ORDER — ASPIRIN 81 MG PO CHEW
81.0000 mg | CHEWABLE_TABLET | Freq: Every day | ORAL | Status: DC
  Administered 2019-04-06 – 2019-04-13 (×8): 81 mg
  Filled 2019-04-06 (×8): qty 1

## 2019-04-06 MED ORDER — VECURONIUM BROMIDE 10 MG IV SOLR
10.0000 mg | Freq: Once | INTRAVENOUS | Status: AC
  Administered 2019-04-06: 10 mg via INTRAVENOUS

## 2019-04-06 MED ORDER — VITAL HIGH PROTEIN PO LIQD
1000.0000 mL | ORAL | Status: DC
  Administered 2019-04-06 – 2019-04-10 (×2): 1000 mL

## 2019-04-06 MED ORDER — ARTIFICIAL TEARS OPHTHALMIC OINT
1.0000 "application " | TOPICAL_OINTMENT | Freq: Three times a day (TID) | OPHTHALMIC | Status: DC
  Administered 2019-04-06 – 2019-04-09 (×9): 1 via OPHTHALMIC
  Filled 2019-04-06: qty 3.5

## 2019-04-06 MED ORDER — DEXTROSE 5 % IV SOLN
INTRAVENOUS | Status: DC
  Administered 2019-04-06 (×2): via INTRAVENOUS

## 2019-04-06 MED ORDER — SODIUM CHLORIDE 0.45 % IV SOLN
INTRAVENOUS | Status: DC
  Administered 2019-04-06: 06:00:00 via INTRAVENOUS

## 2019-04-06 MED ORDER — FREE WATER
250.0000 mL | Freq: Four times a day (QID) | Status: DC
  Administered 2019-04-06 – 2019-04-09 (×13): 250 mL

## 2019-04-06 NOTE — Progress Notes (Addendum)
PROGRESS NOTE                                                                                                                                                                                                             Patient Demographics:    Christine Patterson, is a 48 y.o. female, DOB - July 17, 1971, FXJ:[id number]  Outpatient Primary MD for the patient is System, Pcp Not In    LOS - 1  Admit date - 04/09/2019    CC - found down     Brief Narrative  Christine Patterson is a 47 y.o. 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  I discussed with her daughter her HPI on 04/06/2019, according to the daughter patient was not feeling well on 04/04/2019 evening, she was very sleepy and had an episode of incontinence, she then went to bed but was unresponsive in the morning, when EMS came she was found to have agonal respiration with a pulse ox of 50%.  She was intubated and [facility] unfortunately lost a tooth, she continued to have profound hypoxic respiratory failure along with signs of sepsis, tested COVID positive, had hypotension and shock.  She was started on vasopressors, sent to [facility] for further care.   Subjective:    Christine Patterson today remains obtunded on ventilator, currently not on sedation but no spontaneous neurological activity.   Assessment  & Plan :     1. Acute Hypoxic Resp. Failure due to Acute Covid 19 Viral Pneumonitis during the ongoing 2020 Covid 19 Pandemic -  Cannot rule out concurrent bacterial aspiration pneumonia when she was unresponsive at home as she has leukocytosis and baseline procalcitonin was 3.2, she is getting IV steroids, IV REMDESIVIR, empiric IV vancomycin and cefepime.  Continue  ventilatory support.  Still has high FiO2 requirements.  PCCM following and managing sepsis and ventilator.  Vent Mode: PRVC FiO2 (%):  [80 %-100 %] 80 % Set Rate:  [26 bmp-35 bmp] 35 bmp Vt Set:  [300 mL-400 mL] 400 mL PEEP:  [16 cmH20] 16 cmH20 Plateau Pressure:  [17 cmH20-27 cmH20] 27 cmH20  ABG     Component Value Date/Time  PHART 7.305 (L) 04/06/2019 0450   PCO2ART 54.7 (H) 04/06/2019 0450   PO2ART 77.0 (L) 04/06/2019 0450   HCO3 27.2 04/06/2019 0450   TCO2 29 04/06/2019 0450   O2SAT 94.0 04/06/2019 0450    COVID-19 Labs  Recent Labs    04/18/2019 2015 04/06/19 0310 04/06/19 0315  DDIMER 4.49* 4.47*  --   FERRITIN 334*  --  299  CRP 35.4*  --  33.6*    No results found for: SARSCOV2NAA   Hepatic Function Latest Ref Rng & Units 04/06/2019 04/11/2019  Total Protein 6.5 - 8.1 g/dL 6.8 7.0  Albumin 3.5 - 5.0 g/dL 2.4(L) 2.5(L)  AST 15 - 41 U/L 100(H) 92(H)  ALT 0 - 44 U/L 35 34  Alk Phosphatase 38 - 126 U/L 58 65  Total Bilirubin 0.3 - 1.2 mg/dL 0.4 0.6     No results found for: BNP    2.  Metabolic/toxic encephalopathy with history of underlying neurosarcoidosis- CT nonacute, nonspecific finding noted, MRI if stable or outpatient, EEG here to rule out anoxic injury.  Continue to monitor off sedation as much as possible.  3.  DI.  Continue Synthroid, currently on vasopressin, will monitor sodium levels if they drop further will add desmopressin discussed with pharmacy.  4.  Panhypopituitarism.  On IV Synthroid and IV steroids.  5.  AKI.  Improved after IV fluids.  Continue to monitor.  6.  Hypernatremia.  Could be due to DI, currently on vasopressin may need addition of desmopressin.  Free water flushes continued via OG tube.  7.  Transaminitis.  Could be due to COVID-19 versus shock liver.  Trend.  8. DM type II.  Currently on steroids.  Continue ISS will add Lantus if needed.   No results found for: HGBA1C CBG (last 3)  Recent Labs    04/23/2019 2231  04/06/19 0520 04/06/19 0751  GLUCAP 112* 126* 119*     Addendum - 6pm, ? Changes on Tele, EKG done with possible non specific ST changes, DW Dr Meda Coffee Cards, Stat TTE ordered, ASA-Statin via OG, BP low for B Blocker, cycle Trop, 1 set  is 30 texted at 7.01 pm by RN, will call Cards consult as well.     Pharmacy consulted for home med rec.   Condition - Extremely Guarded  Family Communication  :  Daughter in detail 04/16/2019  Code Status :  Full  Diet : OG  Diet Order            Diet NPO time specified  Diet effective now               Disposition Plan  : ICU  Consults  :  PCCM  Procedures  :    CT Head - non acute, will need  MRI ? Small growth  EEG -   PUD Prophylaxis : Pepcid  DVT Prophylaxis  :  Lovenox   Lab Results  Component Value Date   PLT 192 04/06/2019    Inpatient Medications  Scheduled Meds: . chlorhexidine gluconate (MEDLINE KIT)  15 mL Mouth Rinse BID  . Chlorhexidine Gluconate Cloth  6 each Topical Daily  . enoxaparin (LOVENOX) injection  70 mg Subcutaneous Q12H  . famotidine  20 mg Per Tube BID  . feeding supplement (PRO-STAT SUGAR FREE 64)  30 mL Per Tube Daily  . free water  250 mL Per Tube Q6H  . hydrocortisone sod succinate (SOLU-CORTEF) inj  100 mg Intravenous Q6H  . insulin aspart  0-9 Units Subcutaneous Q8H  . levothyroxine  125 mcg Per Tube Q0600  . mouth rinse  15 mL Mouth Rinse 10 times per day  . vitamin C  500 mg Per Tube Daily  . zinc sulfate  220 mg Per Tube Daily   Continuous Infusions: . sodium chloride Stopped (05/05/2019 2033)  . ceFEPime (MAXIPIME) IV Stopped (04/06/19 0600)  . dextrose 125 mL/hr at 04/06/19 0900  . feeding supplement (VITAL HIGH PROTEIN) 1,000 mL (04/06/19 1006)  . fentaNYL infusion INTRAVENOUS 50 mcg/hr (04/06/19 0947)  . metronidazole Stopped (04/06/19 0407)  . midazolam    . norepinephrine (LEVOPHED) Adult infusion Stopped (04/06/19 0831)  . remdesivir 100 mg in NS 250 mL    . vancomycin  166.7 mL/hr at 04/06/19 0900  . vasopressin (PITRESSIN) infusion - *FOR SHOCK* 0.03 Units/min (04/06/19 0900)   PRN Meds:.chlorpheniramine-HYDROcodone, fentaNYL, guaiFENesin-dextromethorphan, midazolam **OR** midazolam  Antibiotics  :    Anti-infectives (From admission, onward)   Start     Dose/Rate Route Frequency Ordered Stop   04/06/19 2200  remdesivir 100 mg in sodium chloride 0.9 % 250 mL IVPB     100 mg 500 mL/hr over 30 Minutes Intravenous Every 24 hours 04/19/2019 2206 04/10/19 2159   04/06/19 0800  vancomycin (VANCOCIN) 1,250 mg in sodium chloride 0.9 % 250 mL IVPB     1,250 mg 166.7 mL/hr over 90 Minutes Intravenous Every 24 hours 04/25/2019 2225     04/06/19 0600  ceFEPIme (MAXIPIME) 2 g in sodium chloride 0.9 % 100 mL IVPB     2 g 200 mL/hr over 30 Minutes Intravenous Every 8 hours 04/06/2019 2225     04/16/2019 2300  remdesivir 200 mg in sodium chloride 0.9 % 250 mL IVPB     200 mg 500 mL/hr over 30 Minutes Intravenous Once 04/29/2019 2206 04/19/2019 2334   04/12/2019 2030  vancomycin (VANCOCIN) 2,500 mg in sodium chloride 0.9 % 500 mL IVPB  Status:  Discontinued     2,500 mg 250 mL/hr over 120 Minutes Intravenous  Once 04/29/2019 1926 04/22/2019 2116   04/12/2019 2000  ceFEPIme (MAXIPIME) 2 g in sodium chloride 0.9 % 100 mL IVPB     2 g 200 mL/hr over 30 Minutes Intravenous  Once 04/28/2019 1926 04/27/2019 2112   04/30/2019 2000  metroNIDAZOLE (FLAGYL) IVPB 500 mg     500 mg 100 mL/hr over 60 Minutes Intravenous Every 8 hours 04/23/2019 1926         Time Spent in minutes  30   Lala Lund M.D on 04/06/2019 at 11:03 AM  To page go to [email or web address] - password [facility]  [facility] -  Office  [phone]    See all Orders from today for further details    Objective:   Vitals:   04/06/19 0600 04/06/19 0724 04/06/19 0800 04/06/19 0900  BP: 107/65 97/71 (!) 97/52 105/65  Pulse: 73 75 90 92  Resp: (!) 35 (!) 35 (!) 27 (!) 30  Temp:   98.4 F (36.9 C)   TempSrc:   Oral   SpO2:  95% 91% 91% (!) 71%  Weight:      Height:        Wt Readings from Last 3 Encounters:  04/06/19 132.2 kg     Intake/Output Summary (Last 24 hours) at 04/06/2019 1103 Last data filed at 04/06/2019 0900 Gross per 24 hour  Intake 1617.88 ml  Output 1450 ml  Net 167.88 ml     Physical Exam  Intubated, obtunded  but not sedated, ET-OG-Foley inplace Supple Neck,No JVD, No cervical lymphadenopathy appriciated.  Symmetrical Chest wall movement, Good air movement bilaterally, CTAB RRR,No Gallops,Rubs or new Murmurs, No Parasternal Heave +ve B.Sounds, Abd Soft, No tenderness, No organomegaly appriciated, No rebound - guarding or rigidity. No Cyanosis, Clubbing or edema, No new Rash or bruise      Data Review:    CBC  Recent Labs  Lab 04/19/2019 2015 04/18/2019 2125 05/02/2019 2349 04/06/19 0224 04/06/19 0310 04/06/19 0450  WBC 16.9*  --   --   --  14.7*  --   HGB 12.4 12.9 12.2 13.3 11.4* 16.0*  HCT 44.3 38.0 36.0 39.0 42.5 47.0*  PLT 191  --   --   --  192  --   MCV 94.1  --   --   --  94.7  --   MCH 26.3  --   --   --  25.4*  --   MCHC 28.0*  --   --   --  26.8*  --   RDW 17.2*  --   --   --  17.3*  --   LYMPHSABS 0.9  --   --   --  0.8  --   MONOABS 1.2*  --   --   --  1.1*  --   EOSABS 0.0  --   --   --  0.0  --   BASOSABS 0.0  --   --   --  0.0  --     Chemistries   Recent Labs  Lab 05/01/2019 2015 05/05/2019 2125 05/01/2019 2349 04/06/19 0224 04/06/19 0310 04/06/19 0315 04/06/19 0450  NA 153* 154* 153* 154* 154*  --  155*  K 4.8 4.7 4.7 4.6 4.5  --  4.5  CL 116*  --   --   --  116*  --   --   CO2 26  --   --   --  30  --   --   GLUCOSE 117*  --   --   --  127*  --   --   BUN 20  --   --   --  20  --   --   CREATININE 1.47*  --   --   --  1.20*  --   --   CALCIUM 7.6*  --   --   --  7.6*  --   --   MG 1.7  --   --   --   --  1.9  --   AST 92*  --   --   --  100*  --   --   ALT 34  --   --   --  35  --   --   ALKPHOS 65  --   --   --  58  --   --   BILITOT 0.6   --   --   --  0.4  --   --    ------------------------------------------------------------------------------------------------------------------ No results for input(s): CHOL, HDL, LDLCALC, TRIG, CHOLHDL, LDLDIRECT in the last 72 hours.  No results found for: HGBA1C ------------------------------------------------------------------------------------------------------------------ No results for input(s): TSH, T4TOTAL, T3FREE, THYROIDAB in the last 72 hours.  Invalid input(s): FREET3  Cardiac Enzymes No results for input(s): CKMB, TROPONINI, MYOGLOBIN in the last 168 hours.  Invalid input(s): CK ------------------------------------------------------------------------------------------------------------------ No results found for: BNP  Micro Results No results found for this or any previous visit (from the past 240 hour(s)).  Radiology Reports Ct Head Wo Contrast  Result Date: 04/06/2019 CLINICAL DATA:  48 y/o F; unexplained altered level of consciousness. Found unresponsive. Sepsis and COVID-19 positive. EXAM: CT HEAD WITHOUT CONTRAST TECHNIQUE: Contiguous axial images were obtained from the base of the skull through the vertex without intravenous contrast. COMPARISON:  None. FINDINGS: Brain: No evidence of acute infarction, hemorrhage, hydrocephalus, extra-axial collection or mass effect. Coarse calcifications are present which appear to be centered in the region of hypothalamus and mamillary bodies. Vascular: No hyperdense vessel or unexpected calcification. Skull: Normal. Negative for fracture or focal lesion. Sinuses/Orbits: Right maxillary sinus mucosal thickening. Large left maxillary sinus mucous retention cyst or polyp. Additional included paranasal sinuses and the mastoid air cells are normally aerated. Nasoenteric tube and endotracheal tube noted. Orbits are unremarkable. Other: None. IMPRESSION: 1. No acute intracranial abnormality identified. 2. Coarse calcifications which appears  centered in the region of hypothalamus and mamillary bodies. Findings may represent a small underlying mass such as craniopharyngioma, glioma, and hamartoma or possibly sequelae of prior toxic/inflammatory process. Consider MRI of the brain without and with contrast to better characterize. Electronically Signed   By: Kristine Garbe M.D.   On: 04/06/2019 02:24   Dg Chest Port 1 View  Result Date: 04/06/2019 CLINICAL DATA:  Shortness of breath. EXAM: PORTABLE CHEST 1 VIEW COMPARISON:  04/06/2019. FINDINGS: Endotracheal tube, NG tube, left IJ line stable position. Stable cardiomegaly. Diffuse severe bilateral pulmonary infiltrates/edema again noted. No prominent pleural effusion. No pneumothorax. IMPRESSION: 1.  Lines and tubes stable position. 2. Stable cardiomegaly. Diffuse severe bilateral pulmonary infiltrates/edema again noted. No interim change. Electronically Signed   By: Marcello Moores  Register   On: 04/06/2019 08:42   Dg Chest Port 1 View  Result Date: 04/06/2019 CLINICAL DATA:  Respiratory failure.  COVID-19 positive. EXAM: PORTABLE CHEST 1 VIEW COMPARISON:  No recent prior. FINDINGS: Left IJ line noted with tip over cavoatrial junction. NG tube noted with tip below left hemidiaphragm. Cardiomegaly. Diffuse bilateral pulmonary infiltrates/edema. Tiny left effusion cannot be excluded. No pneumothorax. IMPRESSION: 1. Left IJ line noted with tip over cavoatrial junction. NG tube noted with tip below left hemidiaphragm. 2.  Cardiomegaly. 3. Severe diffuse bilateral pulmonary infiltrates/edema. Tiny left pleural effusion cannot be excluded. Electronically Signed   By: Marcello Moores  Register   On: 04/06/2019 07:36   Dg Abd Portable 1v  Result Date: 04/23/2019 CLINICAL DATA:  Check gastric catheter placement EXAM: PORTABLE ABDOMEN - 1 VIEW COMPARISON:  None. FINDINGS: Scattered large and small bowel gas is noted. Gastric catheter is noted within the distal aspect of the stomach. Patchy infiltrates are noted  within the lungs bilaterally. IMPRESSION: Gastric catheter within the stomach. Electronically Signed   By: Inez Catalina M.D.   On: 04/26/2019 21:55

## 2019-04-06 NOTE — Progress Notes (Signed)
Patient back from CT scan without incident.

## 2019-04-06 NOTE — Progress Notes (Signed)
 Initial Nutrition Assessment   RD working remotely.   DOCUMENTATION CODES:   Morbid obesity  INTERVENTION:   Tube Feeding:  Vital High Protein at 50 ml/hr Pro-Stat 30 mL daily Provides 1300 kcals, 121 g of protein, 1008 mL of free water  NUTRITION DIAGNOSIS:   Inadequate oral intake related to acute illness as evidenced by NPO status.  GOAL:   Provide needs based on ASPEN/SCCM guidelines  MONITOR:   Vent status, Labs, Weight trends, TF tolerance, Skin  REASON FOR ASSESSMENT:   Consult, Ventilator Enteral/tube feeding initiation and management  ASSESSMENT:   48 yo female acute on chronic respiratory failure with coronavirus pneumonitis, ARDS requiring intubation, septic shock, AKI.  PMH includes CHF, GERD, sarcoidosis   Patient is currently intubated on ventilator support, pt currently requiring vasopressin and levophed MV: 13.2 L/min Temp (24hrs), Avg:98.9 F (37.2 C), Min:98.4 F (36.9 C), Max:99.5 F (37.5 C)  Propofol: NONE  Current wt 132.2 kg; no previous wt encounters.  Unable to obtain diet and weight history from pt at this time.   Hypernatremic; pt on D5 at 125 ml/hr in addition to free water 250 mL q 6 hours   Per abdominal xray, OG tube with tip in distal stomach  Stage II PI on sacrum present on admission  Labs: sodium 155 (H), Creatinine 1.20 (H), BUN wdl,  Meds: Vitamin C, zinc sulfate, ss novolog  NUTRITION - FOCUSED PHYSICAL EXAM:  Unable to assess  Diet Order:   Diet Order            Diet NPO time specified  Diet effective now              EDUCATION NEEDS:   Not appropriate for education at this time  Skin:  Skin Assessment: Skin Integrity Issues: Skin Integrity Issues:: Stage II Stage II: sacrum  Last BM:  7/1  Height:   Ht Readings from Last 1 Encounters:  04/24/2019 5\' 2"  (1.575 m)    Weight:   Wt Readings from Last 1 Encounters:  04/06/19 132.2 kg    Ideal Body Weight:  50 kg  BMI:  Body mass index is  53.32 kg/m.  Estimated Nutritional Needs:   Kcal:  1100-1250 kcals  Protein:  110-125 g  Fluid:  >/= 1.5 L    Willi Borowiak MS, RDN, LDN, CNSC [phone] Pager  [phone] Weekend/On-Call Pager

## 2019-04-06 NOTE — Progress Notes (Signed)
Called by Dr. Candiss Norse re: new troponin elevation.  Patient with stable ECG at OSH (relative to baseline, RBBB,  Nonspecific TW changes).  Here today, ectopy or ST changes were noted on telemetry (it is not clear that this was associated with hemodynamic changes or changes in oxygenation).  HsTrop obtained and was ~2x ULN.  ECG obtained and automatic read was ST elevation in inferior leads, by personal read, this is 0.5-30mm concave up ST change in II, III, and Avf without ST depressions elsewhere.  Aspirin and statin were given.  I discussed with Dr. Bronson Ing, who reviewed ECGs, prior ECG from [place] day before admission, and preliminary echo images from today that showed normal LV function.  He agreed her findings so far are reassuring; recommended continued monitoring for now, repeat ECG tonight, if ECG changes call back to reconsider heparin.

## 2019-04-06 NOTE — Progress Notes (Signed)
Pt transported to CT and back without event with primary RN Ronny Bacon, RN Brittney, and respiratory therapy.

## 2019-04-06 NOTE — Consult Note (Signed)
NAME:  Christine Patterson, MRN:  [id number], DOB:  04/12/1971, LOS: 1 ADMISSION DATE:  Apr 20, 2019, CONSULTATION DATE: 04/06/2019 REFERRING MD: [facility] hospitalist, CHIEF COMPLAINT: Respiratory failure  Brief History   48 year old morbidly obese woman with chronic hypoxemic respiratory failure transferred from outside hospital for [place] related pneumonia requiring intubation. Possibility of prolonged downtime at home.  History of present illness   Patient is on baseline 2 L of oxygen at home.  Daughter reported to Dr. Candiss Norse the patient had been more lethargic and confused in the day prior to admission.  She was found unresponsive by a neighbor. Breathing described as agonal with saturations in the 50s by EMS. Intubated at hospital in [place], [place].  Developed hypotension requiring vasopressors. Transferred to [facility] due to bed availability.  Past Medical History   Past Medical History:  Diagnosis Date  . CHF (congestive heart failure) ([place])   . Diabetes insipidus ([place])   . GERD (gastroesophageal reflux disease)   . Glaucoma    L eye blind  . HTN (hypertension)   . Hypothyroidism   . Obesity hypoventilation syndrome (HCC)    on chronic home O2 2L [place]  . OSA (obstructive sleep apnea)   . Osteoporosis   . Panhypopituitarism ([place])   . Pulmonary hypertension ([place])   . Sarcoidosis    w/chronic steroid use    Significant Hospital Events   On arrival patient was started on COVID treatment as well as broad-spectrum antibiotics for possible aspiration pneumonia.  Consults:  PCCM   Procedures:  None  Significant Diagnostic Tests:  Chest x-ray 04/06/2019: Diffuse bilateral interstitial pattern with additional bibasilar consolidation with air bronchograms (personal interpretation) CT scan head 7/1: No clear acute intracranial pathology.  Some blurring of gray-white differentiation which may represent motion degradation.  Calcifications noted in the area of the circle of Willis  consistent with neurosarcoidosis.  Micro Data:  MRSA screen negative ET aspirate: Pending  Antimicrobials:  Cefepime 7/1 Vancomycin 7/1 Metronidazole 7/1 Remdesivir 7/1  Interim history/subjective:  Has remained largely unresponsive overnight despite being off sedation.  Received 1 dose of Versed for coughing this a.m.  Objective   Blood pressure 105/65, pulse 92, temperature 98.4 F (36.9 C), temperature source Oral, resp. rate (!) 30, height 5\' 2"  (1.575 m), weight 132.2 kg, SpO2 (!) 71 %.    Vent Mode: PRVC FiO2 (%):  [80 %-100 %] 80 % Set Rate:  [26 bmp-35 bmp] 35 bmp Vt Set:  [300 mL-400 mL] 400 mL PEEP:  [16 cmH20] 16 cmH20 Plateau Pressure:  [17 cmH20-27 cmH20] 27 cmH20   Intake/Output Summary (Last 24 hours) at 04/06/2019 1107 Last data filed at 04/06/2019 0900 Gross per 24 hour  Intake 1617.88 ml  Output 1450 ml  Net 167.88 ml   Filed Weights   04/06/19 0500  Weight: 132.2 kg    Examination: General: Short stature, morbidly obese. HENT: ET tube and OG tube in place. Lungs: No ventilator dyssynchrony.  Chest rise is equal.  Vesicular breath sounds anteriorly, bronchovesicular breath sounds present from anterior axillary line back bilaterally, more prominent on left side. Cardiovascular: Heart sounds are unremarkable.  Unable to assess JVP.  No peripheral edema. Abdomen: Abdomen is protuberant but soft. Extremities: No evidence of active joint disease.  Line sites are intact. Neuro: Patient is spontaneously moving both lower extremities.  She winces and withdraws purposefully to pain. GU: Foley catheter in place clear urine output.  I performed a bedside echocardiogram and lung ultrasound: Predominantly interstitial pattern  anteriorly with consolidation evident at bases bilaterally.  Poor acoustic cardiac windows.  Evidence of RV dilatation without septal shift.  RV systolic function appears normal at this time (normal  TAPSE).  LV systolic function normal  lipomatous septum.  Resolved Hospital Problem list   N/A  Assessment & Plan:   Critically ill due to acute hypoxic and hypercapnic respiratory failure requiring mechanical ventilation. Currently on lung protective ventilation strategy. Peak and driving pressures are acceptable, but patient remains on high FiO2 and continues to desaturate. No response to recruitment maneuver and evidence of dense posterior consolidation next lung recruitment by increased PEEP unlikely. Evidence of possible RV dysfunction is a poor prognostic sign. Attempt to wean FiO2 to 0.6 this morning Prone today if FiO2 remains elevated.  COVID-19 related pneumonia with possible aspiration pneumonia COVID inflammatory markers not elevated Respiratory failure may reflect aspiration event. Continue current antibiotic treatment pending sputum cultures. Continue antiviral agent Hold on immune modulating agents given possible sepsis.  Critically ill due to hypotension and shock requiring titration of vasopressors. While this may represent sepsis from her superimposed bacterial pneumonia, acute cor pulmonale with RV dysfunction may also be responsible. RV function should improve with prone ventilation. Continue to titrate vasopressin and norepinephrine to maintain MAP>65.  Acute toxic metabolic encephalopathy with possible component of hypoxic ischemic injury. Minimize sedation and follow neurological recovery.  History of panhypopituitarism and chronic steroid dependence. Continue stress dose steroids and thyroid replacement.    Best practice:  Diet: Initiate tube feedings Pain/Anxiety/Delirium protocol (if indicated): Fentanyl and Versed as needed as needed  VAP protocol (if indicated): Bundle in place DVT prophylaxis: High-dose Lovenox GI prophylaxis: Famotidine Glucose control: Phase 1 glycemic protocol Mobility: Bedrest Code Status: Full code Family Communication: Family updated by Dr. Thedore MinsSingh.  Disposition: ICU. Labs   CBC: Recent Labs  Lab 04/17/2019 2015 05/02/2019 2125 04/11/2019 2349 04/06/19 0224 04/06/19 0310 04/06/19 0450  WBC 16.9*  --   --   --  14.7*  --   NEUTROABS 14.8*  --   --   --  12.6*  --   HGB 12.4 12.9 12.2 13.3 11.4* 16.0*  HCT 44.3 38.0 36.0 39.0 42.5 47.0*  MCV 94.1  --   --   --  94.7  --   PLT 191  --   --   --  192  --     Basic Metabolic Panel: Recent Labs  Lab 05/01/2019 2015 04/10/2019 2125 04/29/2019 2349 04/06/19 0224 04/06/19 0310 04/06/19 0315 04/06/19 0450  NA 153* 154* 153* 154* 154*  --  155*  K 4.8 4.7 4.7 4.6 4.5  --  4.5  CL 116*  --   --   --  116*  --   --   CO2 26  --   --   --  30  --   --   GLUCOSE 117*  --   --   --  127*  --   --   BUN 20  --   --   --  20  --   --   CREATININE 1.47*  --   --   --  1.20*  --   --   CALCIUM 7.6*  --   --   --  7.6*  --   --   MG 1.7  --   --   --   --  1.9  --    GFR: Estimated Creatinine Clearance: 73.4 mL/min (A) (by C-G formula based on  SCr of 1.2 mg/dL (H)). Recent Labs  Lab 08-22-2019 2015 08-22-2019 2250 04/06/19 0310 04/06/19 0315  PROCALCITON  --   --   --  3.20  WBC 16.9*  --  14.7*  --   LATICACIDVEN 1.4 1.2  --   --     Liver Function Tests: Recent Labs  Lab 08-22-2019 2015 04/06/19 0310  AST 92* 100*  ALT 34 35  ALKPHOS 65 58  BILITOT 0.6 0.4  PROT 7.0 6.8  ALBUMIN 2.5* 2.4*   No results for input(s): LIPASE, AMYLASE in the last 168 hours. No results for input(s): AMMONIA in the last 168 hours.  ABG    Component Value Date/Time   PHART 7.305 (L) 04/06/2019 0450   PCO2ART 54.7 (H) 04/06/2019 0450   PO2ART 77.0 (L) 04/06/2019 0450   HCO3 27.2 04/06/2019 0450   TCO2 29 04/06/2019 0450   O2SAT 94.0 04/06/2019 0450     Coagulation Profile: Recent Labs  Lab 08-22-2019 2015  INR 1.2    Cardiac Enzymes: No results for input(s): CKTOTAL, CKMB, CKMBINDEX, TROPONINI in the last 168 hours.  HbA1C: No results found for: HGBA1C  CBG: Recent Labs  Lab  08-22-2019 2026 08-22-2019 2231 04/06/19 0520 04/06/19 0751  GLUCAP 105* 112* 126* 119*      CRITICAL CARE Performed by: Lynnell Catalanavi Alijah Hyde   Total critical care time: 60 minutes  Critical care time was exclusive of separately billable procedures and treating other patients.  Critical care was necessary to treat or prevent imminent or life-threatening deterioration.  Critical care was time spent personally by me on the following activities: development of treatment plan with patient and/or surrogate as well as nursing, discussions with consultants, evaluation of patient's response to treatment, examination of patient, obtaining history from patient or surrogate, ordering and performing treatments and interventions, ordering and review of laboratory studies, ordering and review of radiographic studies, pulse oximetry, re-evaluation of patient's condition and participation in multidisciplinary rounds.  Lynnell Catalanavi Shaneen Reeser, MD [facility] ICU Physician [facility]  Pager: [phone] Mobile: [phone] After hours: [phone].

## 2019-04-06 NOTE — Progress Notes (Signed)
Troponin was reported to MD. Aware, will consult with Cardiology.

## 2019-04-06 NOTE — Progress Notes (Signed)
Pt proned at 15:15, pt has bradycardic episode, ? ST elevation, 12 lead EKG completed MD aware and reviewed, trop ordered. Pt coughing and fighting vent, unable to ventilate. Versed and Vecuronium given. Versed drip started, pt now resting comfortable and VSS.

## 2019-04-06 NOTE — Progress Notes (Signed)
Critical ABG results called to [place]. Increased VT to 8cc/kg 400. Repeat ABG.

## 2019-04-06 NOTE — Progress Notes (Signed)
Patient transported to CT 

## 2019-04-06 NOTE — Progress Notes (Signed)
RT NOTE:  Pt in prone position. RT assisted RN with head turn toLeft. Oral care provided. ETT secured throughout @ 22cm/lip 

## 2019-04-06 NOTE — Progress Notes (Signed)
Pt sister called, family  Updated and all questions answered. MD to call family later today.

## 2019-04-06 NOTE — Progress Notes (Signed)
Spoke with Dr. Loleta Books. He consulted with Cardiology concerning pts troponin. Will continue to trend troponin through the shift and repeat EKG after patient is place in supine position 7/3 am. If troponin trends up, we will place patient in supine position and repeat EKG this shift. Will continue to treat and monitor patient per MD order.

## 2019-04-06 NOTE — Progress Notes (Signed)
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 It has been held here due to her being on the vasopressin drip. Her sodium cont to be elevated. There is not good info on correlation between DDAVP and vasopressin dose. She has been on the shock dose of vasopressin. D/w Dr. Candiss Norse and we will go ahead and give SQ DDAVP in addition to her vasopressin.   DDAVP 2mg  SQ BID  Onnie Boer, PharmD, [place], AAHIVP, CPP Infectious Disease Pharmacist 04/06/2019 2:14 PM

## 2019-04-06 NOTE — Progress Notes (Signed)
  Echocardiogram 2D Echocardiogram has been performed.  Christine Patterson 04/06/2019, 6:35 PM

## 2019-04-06 NOTE — Progress Notes (Addendum)
[facility] of [facility]s. [phone]    Chart review>>media

## 2019-04-06 NOTE — Progress Notes (Signed)
  Critical ABG results reported to [place].  7.21/73.8/81/29.6 Order to increased VT to 7cc/kg. Increased to 350. Repeat ABG in one hour.

## 2019-04-06 NOTE — Progress Notes (Signed)
[place] Progress Note Patient Name: Christine Patterson DOB: [birth year] MRN: [id number]   Date of Service  04/06/2019  HPI/Events of Note  Persistent hypercapnia despite Vt of 7 ml/kg and RR 35,airway  Pressures 20-29  eICU Interventions  Order entered to increase Vt to 8 ml/kg with repeat ABG in one hour        [place] U Ogan 04/06/2019, 2:39 AM

## 2019-04-06 NOTE — Progress Notes (Addendum)
RT NOTE:  Pt in prone position. RT assisted RN with head turn toRight. Oral care provided. ETT secured throughout @ 22cm/lip 

## 2019-04-07 ENCOUNTER — Inpatient Hospital Stay: Payer: Self-pay

## 2019-04-07 DIAGNOSIS — J80 Acute respiratory distress syndrome: Secondary | ICD-10-CM

## 2019-04-07 DIAGNOSIS — Z0189 Encounter for other specified special examinations: Secondary | ICD-10-CM

## 2019-04-07 LAB — POCT I-STAT 7, (LYTES, BLD GAS, ICA,H+H)
Bicarbonate: 26.7 mmol/L (ref 20.0–28.0)
Calcium, Ion: 1.14 mmol/L — ABNORMAL LOW (ref 1.15–1.40)
HCT: 33 % — ABNORMAL LOW (ref 36.0–46.0)
Hemoglobin: 11.2 g/dL — ABNORMAL LOW (ref 12.0–15.0)
O2 Saturation: 91 %
Patient temperature: 36.4
Potassium: 4.1 mmol/L (ref 3.5–5.1)
Sodium: 151 mmol/L — ABNORMAL HIGH (ref 135–145)
TCO2: 28 mmol/L (ref 22–32)
pCO2 arterial: 48.9 mmHg — ABNORMAL HIGH (ref 32.0–48.0)
pH, Arterial: 7.342 — ABNORMAL LOW (ref 7.350–7.450)
pO2, Arterial: 63 mmHg — ABNORMAL LOW (ref 83.0–108.0)

## 2019-04-07 LAB — CBC WITH DIFFERENTIAL/PLATELET
Abs Immature Granulocytes: 0.11 10*3/uL — ABNORMAL HIGH (ref 0.00–0.07)
Basophils Absolute: 0 10*3/uL (ref 0.0–0.1)
Basophils Relative: 0 %
Eosinophils Absolute: 0 10*3/uL (ref 0.0–0.5)
Eosinophils Relative: 0 %
HCT: 35.9 % — ABNORMAL LOW (ref 36.0–46.0)
Hemoglobin: 9.5 g/dL — ABNORMAL LOW (ref 12.0–15.0)
Immature Granulocytes: 1 %
Lymphocytes Relative: 9 %
Lymphs Abs: 0.8 10*3/uL (ref 0.7–4.0)
MCH: 25.3 pg — ABNORMAL LOW (ref 26.0–34.0)
MCHC: 26.5 g/dL — ABNORMAL LOW (ref 30.0–36.0)
MCV: 95.7 fL (ref 80.0–100.0)
Monocytes Absolute: 0.5 10*3/uL (ref 0.1–1.0)
Monocytes Relative: 5 %
Neutro Abs: 8.1 10*3/uL — ABNORMAL HIGH (ref 1.7–7.7)
Neutrophils Relative %: 85 %
Platelets: 152 10*3/uL (ref 150–400)
RBC: 3.75 MIL/uL — ABNORMAL LOW (ref 3.87–5.11)
RDW: 17.7 % — ABNORMAL HIGH (ref 11.5–15.5)
WBC: 9.6 10*3/uL (ref 4.0–10.5)
nRBC: 0.8 % — ABNORMAL HIGH (ref 0.0–0.2)

## 2019-04-07 LAB — COMPREHENSIVE METABOLIC PANEL
ALT: 28 U/L (ref 0–44)
AST: 66 U/L — ABNORMAL HIGH (ref 15–41)
Albumin: 1.8 g/dL — ABNORMAL LOW (ref 3.5–5.0)
Alkaline Phosphatase: 50 U/L (ref 38–126)
Anion gap: 8 (ref 5–15)
BUN: 21 mg/dL — ABNORMAL HIGH (ref 6–20)
CO2: 24 mmol/L (ref 22–32)
Calcium: 6.5 mg/dL — ABNORMAL LOW (ref 8.9–10.3)
Chloride: 110 mmol/L (ref 98–111)
Creatinine, Ser: 0.78 mg/dL (ref 0.44–1.00)
GFR calc Af Amer: >60 mL/min (ref >60)
GFR calc non Af Amer: >60 mL/min (ref >60)
Glucose, Bld: 415 mg/dL — ABNORMAL HIGH (ref 70–99)
Potassium: 3.7 mmol/L (ref 3.5–5.1)
Sodium: 142 mmol/L (ref 135–145)
Total Bilirubin: 0.1 mg/dL — ABNORMAL LOW (ref 0.3–1.2)
Total Protein: 5.4 g/dL — ABNORMAL LOW (ref 6.5–8.1)

## 2019-04-07 LAB — MAGNESIUM
Magnesium: 1.8 mg/dL (ref 1.7–2.4)
Magnesium: 2.3 mg/dL (ref 1.7–2.4)

## 2019-04-07 LAB — GLUCOSE, CAPILLARY
Glucose-Capillary: 117 mg/dL — ABNORMAL HIGH (ref 70–99)
Glucose-Capillary: 124 mg/dL — ABNORMAL HIGH (ref 70–99)
Glucose-Capillary: 130 mg/dL — ABNORMAL HIGH (ref 70–99)
Glucose-Capillary: 136 mg/dL — ABNORMAL HIGH (ref 70–99)
Glucose-Capillary: 138 mg/dL — ABNORMAL HIGH (ref 70–99)
Glucose-Capillary: 138 mg/dL — ABNORMAL HIGH (ref 70–99)

## 2019-04-07 LAB — FERRITIN: Ferritin: 245 ng/mL (ref 11–307)

## 2019-04-07 LAB — D-DIMER, QUANTITATIVE: D-Dimer, Quant: 2.23 ug/mL-FEU — ABNORMAL HIGH (ref 0.00–0.50)

## 2019-04-07 LAB — TROPONIN I (HIGH SENSITIVITY): Troponin I (High Sensitivity): 19 ng/L — ABNORMAL HIGH (ref <18)

## 2019-04-07 LAB — PHOSPHORUS
Phosphorus: 1.1 mg/dL — ABNORMAL LOW (ref 2.5–4.6)
Phosphorus: 1 mg/dL — CL (ref 2.5–4.6)

## 2019-04-07 LAB — PROCALCITONIN: Procalcitonin: 1.23 ng/mL

## 2019-04-07 LAB — C-REACTIVE PROTEIN: CRP: 18.1 mg/dL — ABNORMAL HIGH (ref <1.0)

## 2019-04-07 MED ORDER — K PHOS MONO-SOD PHOS DI & MONO 155-852-130 MG PO TABS
500.0000 mg | ORAL_TABLET | Freq: Three times a day (TID) | ORAL | Status: DC
  Administered 2019-04-07: 500 mg
  Filled 2019-04-07 (×4): qty 2

## 2019-04-07 MED ORDER — VANCOMYCIN HCL 10 G IV SOLR
2000.0000 mg | INTRAVENOUS | Status: DC
  Administered 2019-04-07 – 2019-04-11 (×5): 2000 mg via INTRAVENOUS
  Filled 2019-04-07 (×6): qty 2000

## 2019-04-07 MED ORDER — K PHOS MONO-SOD PHOS DI & MONO 155-852-130 MG PO TABS
500.0000 mg | ORAL_TABLET | ORAL | Status: AC
  Administered 2019-04-07 – 2019-04-08 (×3): 500 mg
  Filled 2019-04-07 (×4): qty 2

## 2019-04-07 MED ORDER — K PHOS MONO-SOD PHOS DI & MONO 155-852-130 MG PO TABS
500.0000 mg | ORAL_TABLET | Freq: Three times a day (TID) | ORAL | Status: DC
  Filled 2019-04-07 (×2): qty 2

## 2019-04-07 NOTE — Progress Notes (Signed)
RT NOTE:  Pt in prone position. RT assisted RN with head turn toRight. Oral care provided. ETT secured throughout @ 22cm/lip 

## 2019-04-07 NOTE — Progress Notes (Signed)
RT NOTE:  Pt in prone position. RT assisted RN with head turn toRight. Oral care provided. ETT secured throughout @ 22cm/lip

## 2019-04-07 NOTE — Progress Notes (Signed)
Phos lab 1.0 was reported to MD [facility]). See new orders.

## 2019-04-07 NOTE — Progress Notes (Signed)
RT NOTE:  Pt in prone position. RT assisted RN with head turn toLeft. Oral care provided. ETT secured throughout @ 22cm/lip

## 2019-04-07 NOTE — Progress Notes (Signed)
RT NOTE:  Pt in prone position. RT assisted RN with head turn toLeft. Oral care provided. ETT secured throughout @ 22cm/lip 

## 2019-04-07 NOTE — Progress Notes (Signed)
NAME:  Christine Patterson, MRN:  [id number], DOB:  1970/11/07, LOS: 2 ADMISSION DATE:  05/02/2019, CONSULTATION DATE:  7/2 REFERRING MD:  Dr. Thedore MinsSingh, CHIEF COMPLAINT:  dyspnea  Brief History   [age] y/o female with a complex medical history admitted for ARDS from COVID pneumonia.  There was also concern for aspiration pneumonia on admission.    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, no pulmonary involvement initially; worsening symptoms 2019, started on infliximab; 2020 HRCT showed findings worrisome for pulmonary parenchymal disease from sarcoidosis, likely pulmonary hypertension Prior tobacco abuse  Significant Hospital Events    7/2 admission, placed in prone position  Consults:  PCCM  Procedures:  7/1 ETT >  7/2 L IJ CVL >   Significant Diagnostic Tests:  12/2018 HRCT showed findings worrisome for pulmonary parenchymal disease from sarcoidosis, likely pulmonary hypertension 11/2017 TTE> moderate LVH, LVEF normal, RV enlarted, hypocontractile free wall, mild global decrease, RVSP 38 mmHg; atrial shunting noted  Micro Data:  6/30 SARS-COV-2> positive 6/30 blood culture OSH > 1/4 coag neg staph 7/1 urine culture > klebsiella 7/2 resp culture   Antimicrobials:  7/1 cefepime>  7/1 flagyl >  7/1 remdesivir >  7/1vancomycin >  7/1 hydrocortisone >   Interim history/subjective:  No acute events overnight Remains on high-dose PEEP/FiO2 for severe acute respiratory failure with hypoxemia. Prone position  Objective   Blood pressure 123/79, pulse 66, temperature (!) 97.4 F (36.3 C), temperature source Axillary, resp. rate (!) 35, height 5\' 2"  (1.575 m), weight 134.6 kg, SpO2 96 %.    Vent Mode: PRVC FiO2 (%):  [50 %-80 %] 50 % Set Rate:  [35 bmp] 35 bmp Vt Set:  [400 mL] 400 mL PEEP:  [18  cmH20-20 cmH20] 18 cmH20 Plateau Pressure:  [20 cmH20-33 cmH20] 28 cmH20   Intake/Output Summary (Last 24 hours) at 04/07/2019 0743 Last data filed at 04/07/2019 0606 Gross per 24 hour  Intake 5409.11 ml  Output 1535 ml  Net 3874.11 ml   Filed Weights   04/06/19 0500 04/07/19 0500  Weight: 132.2 kg 134.6 kg    Examination:  General:  In bed on vent, prone position HENT: NCAT ETT in place PULM: CTA B, vent supported breathing CV: RRR, no mgr GI: BS+, soft, nontender MSK: normal bulk and tone Neuro: sedated on vent  April 06, 2019 chest x-ray images independently reviewed showing endotracheal tube and left IJ central line in place, bilateral consolidation and airspace disease, cardiomegaly  Resolved Hospital Problem list     Assessment & Plan:  ARDS due to COVID 19 pneumonia: Baseline atrial shunt Continue full mechanical ventilatory support via ARDS protocol Target tidal volume 6 to 8 cc/kg ideal body weight Target driving pressure less than 15 cm of water Monitor plateau pressure, ideally less than 30 cm of water but considering her obesity suspect she has high intrathoracic pressure at baseline Maintain PEEP at 18 cm of water, arguably lung compliance curve on volume control ventilation suggest that she could actually tolerate a bit more of PEEP if necessary Titrate FiO2 to maintain PaO2 target 55-65 As long as PaO2 to FiO2 ratio is less than 150 would continue prone positioning 16 hours a day Ventilator associated pneumonia prevention protocol: While prone mouth care every 2 hours, chlorhexidine twice a day, trickle feeds in the prone position, reverse Trendelenburg 25 degrees Remdesivir per protocol Hold actemra given  baseline infliximab use/immunocompromised state Continue hydrocortisone  Aspiration pneumonia > continue cefepime, flagyl, vanc for now  UTI (Klebsiella OSH) > continue cefepime  Immunocompromised As above  Pan-hypopituitarism > continue  hydrocortisone, vasopressin, synthroid > monitor sodium > may be able to stop vasopressin if BP improved and change back to DDAVP tomorrow  Neuro and pulmonary sarcoidosis > continue hydrocortisone, no infliximab while critically ill  Baseline pulmonary hypertension and atrial shunting, now in acute decompensated cor-pulmonal from severe acute respiratory failure > continue levophed and vasopressin titrated for MAP > 65  Prognosis poor given baseline comorbid illnesses  Best practice:  Diet: tube feeding Pain/Anxiety/Delirium protocol (if indicated): RASS -4 to -5 per intermittent vecuronium protocol VAP protocol (if indicated): yes DVT prophylaxis: yes GI prophylaxis: famotidine Glucose control: SSI Mobility: bed rest Code Status: Full > need to discuss more with family Family Communication: per [facility] today Disposition: remain in ICU  Labs   CBC: Recent Labs  Lab 05-03-2019 2015  04/06/19 0224 04/06/19 0310 04/06/19 0450 04/06/19 1721 04/07/19 0500  WBC 16.9*  --   --  14.7*  --   --  9.6  NEUTROABS 14.8*  --   --  12.6*  --   --  8.1*  HGB 12.4   < > 13.3 11.4* 16.0* 11.6* 9.5*  HCT 44.3   < > 39.0 42.5 47.0* 34.0* 35.9*  MCV 94.1  --   --  94.7  --   --  95.7  PLT 191  --   --  192  --   --  152   < > = values in this interval not displayed.    Basic Metabolic Panel: Recent Labs  Lab 05/03/19 2015  04/06/19 0224 04/06/19 0310 04/06/19 0315 04/06/19 0450 04/06/19 1030 04/06/19 1636 04/06/19 1721 04/07/19 0500  NA 153*   < > 154* 154*  --  155*  --   --  151* 142  K 4.8   < > 4.6 4.5  --  4.5  --   --  4.3 3.7  CL 116*  --   --  116*  --   --   --   --   --  110  CO2 26  --   --  30  --   --   --   --   --  24  GLUCOSE 117*  --   --  127*  --   --   --   --   --  415*  BUN 20  --   --  20  --   --   --   --   --  21*  CREATININE 1.47*  --   --  1.20*  --   --   --   --   --  0.78  CALCIUM 7.6*  --   --  7.6*  --   --   --   --   --  6.5*  MG 1.7  --   --    --  1.9  --  2.2 2.1  --  1.8  PHOS  --   --   --   --   --   --  2.2* 1.9*  --  1.1*   < > = values in this interval not displayed.   GFR: Estimated Creatinine Clearance: 111.4 mL/min (by C-G formula based on SCr of 0.78 mg/dL). Recent Labs  Lab 05/03/19 2015 05/03/2019 2250 04/06/19 0310 04/06/19 0315 04/07/19 0500  PROCALCITON  --   --   --  3.20 1.23  WBC 16.9*  --  14.7*  --  9.6  LATICACIDVEN 1.4 1.2  --   --   --     Liver Function Tests: Recent Labs  Lab March 20, 2019 2015 04/06/19 0310 04/07/19 0500  AST 92* 100* 66*  ALT 34 35 28  ALKPHOS 65 58 50  BILITOT 0.6 0.4 0.1*  PROT 7.0 6.8 5.4*  ALBUMIN 2.5* 2.4* 1.8*   No results for input(s): LIPASE, AMYLASE in the last 168 hours. No results for input(s): AMMONIA in the last 168 hours.  ABG    Component Value Date/Time   PHART 7.257 (L) 04/06/2019 1721   PCO2ART 64.8 (H) 04/06/2019 1721   PO2ART 90.0 04/06/2019 1721   HCO3 28.9 (H) 04/06/2019 1721   TCO2 31 04/06/2019 1721   O2SAT 95.0 04/06/2019 1721     Coagulation Profile: Recent Labs  Lab March 20, 2019 2015  INR 1.2    Cardiac Enzymes: No results for input(s): CKTOTAL, CKMB, CKMBINDEX, TROPONINI in the last 168 hours.  HbA1C: No results found for: HGBA1C  CBG: Recent Labs  Lab 04/06/19 1325 04/06/19 1459 04/06/19 1619 04/06/19 2335 04/07/19 0424  GLUCAP 172* 168* 174* 186* 138*     Critical care time: 55 minutes     Heber CarolinaBrent Reine Bristow, MD [place] PCCM Pager: [phone] Cell: [phone] If no response, call [phone]

## 2019-04-07 NOTE — Progress Notes (Addendum)
PROGRESS NOTE                                                                                                                                                                                                             Patient Demographics:    Christine Patterson, is a 48 y.o. female, DOB - 12-Nov-1970, VIF:[id number]  Outpatient Primary MD for the patient is System, Pcp Not In    LOS - 2  Admit date - 04/06/2019    CC - found down     Brief Narrative  Christine Patterson is a 48 y.o. 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  I discussed with her daughter her HPI on 04/06/2019, according to the daughter patient was not feeling well on 04/04/2019 evening, she was very sleepy and had an episode of incontinence, she then went to bed but was unresponsive in the morning, when EMS came she was found to have agonal respiration with a pulse ox of 50%.  She was intubated and [facility] unfortunately lost a tooth, she continued to have profound hypoxic respiratory failure along with signs of sepsis, tested COVID positive, had hypotension and shock.  She was started on vasopressors, sent to [facility] for further care.   Subjective:    Christine Patterson today remains sedated on ventilator proned.   Assessment  & Plan :     1. Acute Hypoxic Resp. Failure due to Acute Covid 19 Viral Pneumonitis during the ongoing 2020 Covid 19 Pandemic -  Cannot rule out concurrent bacterial aspiration pneumonia when she was unresponsive at home as she has leukocytosis and baseline procalcitonin was 3.2, she is getting IV steroids, IV REMDESIVIR, empiric IV Vancomycin and Cefepime.  Continue ventilatory support.  Still has high FiO2 requirements.  PCCM following  and managing sepsis and ventilator.  Note she takes outpatient immunomodulator therapy for neurosarcoidosis plus appears to have bacterial infection as well hence Actemra was held.  From pulmonary standpoint she is stable.  Continue to monitor.  Vent Mode: PRVC FiO2 (%):  [50 %-80 %] 50 % Set Rate:  [35 bmp] 35 bmp Vt Set:  [400 mL] 400 mL PEEP:  [  [place] Pressure:  [20 cmH20-33 cmH20] 30 cmH20  ABG     Component Value Date/Time   PHART 7.257 (L) 04/06/2019 1721   PCO2ART 64.8 (H) 04/06/2019 1721   PO2ART 90.0 04/06/2019 1721   HCO3 28.9 (H) 04/06/2019 1721   TCO2 31 04/06/2019 1721   O2SAT 95.0 04/06/2019 1721    COVID-19 Labs  Recent Labs    04/29/2019 2015 04/06/19 0310 04/06/19 0315 04/07/19 0500  DDIMER 4.49* 4.47*  --   --   FERRITIN 334*  --  299 245  CRP 35.4*  --  33.6* 18.1*    No results found for: SARSCOV2NAA   Hepatic Function Latest Ref Rng & Units 04/07/2019 04/06/2019 04/18/2019  Total Protein 6.5 - 8.1 g/dL 5.4(L) 6.8 7.0  Albumin 3.5 - 5.0 g/dL 1.8(L) 2.4(L) 2.5(L)  AST 15 - 41 U/L 66(H) 100(H) 92(H)  ALT 0 - 44 U/L 28 35 34  Alk Phosphatase 38 - 126 U/L 50 58 65  Total Bilirubin 0.3 - 1.2 mg/dL 0.1(L) 0.4 0.6     No results found for: BNP    2.  Metabolic/toxic encephalopathy with history of underlying neurosarcoidosis- CT nonacute, nonspecific finding noted, MRI if stable or outpatient, she has increased some spontaneous movement in all 4 extremities and is now requiring sedation, continue to monitor clinically, if declines anoxic injury can be considered an EEG can be done along with neuro eval for now seems to have mildly improved mental status than what she came in with.  3.  DI.  Continue desmopressin and monitor.  4.  Panhypopituitarism.  On desmopressin, IV Synthroid and IV steroids.  5.  AKI.  Improved after IV fluids.  Continue to monitor.  6.  Hypernatremia.  Was due to DI improved after desmopressin and free  water flushes.  7.  Transaminitis.  Could be due to COVID-19 versus shock liver.  Trend.  8.  Nonspecific EKG changes with non-ACS pattern troponin rise.  Placed on aspirin and statin via OG tube, case discussed with cardiologist Dr. Katha Cabal on 04/06/2019.  Also echocardiogram obtained showing a stable EF and stable wall motion.    9.  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  Supportive care for now most of the work-up at [facility].    10 . DM type II.  Currently on steroids.  Continue ISS will add Lantus if needed.   No results found for: HGBA1C CBG (last 3)  Recent Labs    04/06/19 2335 04/07/19 0424 04/07/19 0809  GLUCAP 186* 138* 138*     Condition - Extremely Guarded  Family Communication  :  Daughter in detail 04/06/19, called daughter on her cell phone on 04/07/2019 informed her that patient is only marginally improved and moving her extremities, still can have underlying anoxic brain injury, still has respiratory failure requiring mechanical ventilation and is still extremely sick.   Code Status :  Full  Diet : OG  Diet Order            Diet NPO time specified  Diet effective now               Disposition Plan  : ICU  Consults  :  PCCM  Procedures  :    CT Head - non acute, will need  MRI ? Small growth  EEG - canceled by Neuro Etta Quill 04/06/19  TTE -  Left Ventricle: The left ventricle  has normal systolic function, with an ejection fraction of 55-60%. The cavity size was normal. There is no increase in left ventricular wall thickness. No evidence of left ventricular regional wall motion  Abnormalities.  L.IJ placed at [facility] 04/11/2019  PICC ordered 04/07/19  PUD Prophylaxis : Pepcid  DVT Prophylaxis  :  Lovenox   Lab Results  Component Value Date   PLT 152 04/07/2019    Inpatient Medications  Scheduled Meds:  artificial tears  1 application Both Eyes V0J    aspirin  81 mg Per Tube Daily   atorvastatin  40 mg Per Tube q1800   chlorhexidine gluconate (MEDLINE KIT)  15 mL Mouth Rinse BID   Chlorhexidine Gluconate Cloth  6 each Topical Daily   enoxaparin (LOVENOX) injection  70 mg Subcutaneous Q12H   famotidine  20 mg Per Tube BID   feeding supplement (PRO-STAT SUGAR FREE 64)  30 mL Per Tube Daily   free water  250 mL Per Tube Q6H   hydrocortisone sod succinate (SOLU-CORTEF) inj  100 mg Intravenous Q6H   insulin aspart  0-9 Units Subcutaneous Q4H   levothyroxine  125 mcg Per Tube Q0600   mouth rinse  15 mL Mouth Rinse 10 times per day   mupirocin ointment  1 application Nasal BID   vitamin C  500 mg Per Tube Daily   zinc sulfate  220 mg Per Tube Daily   Continuous Infusions:  sodium chloride Stopped (04/06/19 2137)   ceFEPime (MAXIPIME) IV Stopped (04/07/19 0636)   feeding supplement (VITAL HIGH PROTEIN) 50 mL/hr at 04/07/19 0400   fentaNYL infusion INTRAVENOUS 125 mcg/hr (04/07/19 0758)   metronidazole Stopped (04/07/19 0532)   midazolam 2 mg/hr (04/07/19 0713)   norepinephrine (LEVOPHED) Adult infusion 2 mcg/min (04/07/19 0700)   remdesivir 100 mg in NS 250 mL Stopped (04/06/19 2249)   vancomycin Stopped (04/06/19 1003)   vasopressin (PITRESSIN) infusion - *FOR SHOCK* 0.03 Units/min (04/07/19 0700)   PRN Meds:.chlorpheniramine-HYDROcodone, fentaNYL, guaiFENesin-dextromethorphan, midazolam **OR** midazolam, vecuronium  Antibiotics  :    Anti-infectives (From admission, onward)   Start     Dose/Rate Route Frequency Ordered Stop   04/06/19 2200  remdesivir 100 mg in sodium chloride 0.9 % 250 mL IVPB     100 mg 500 mL/hr over 30 Minutes Intravenous Every 24 hours 04/21/2019 2206 04/10/19 2159   04/06/19 0800  vancomycin (VANCOCIN) 1,250 mg in sodium chloride 0.9 % 250 mL IVPB     1,250 mg 166.7 mL/hr over 90 Minutes Intravenous Every 24 hours  2225     04/06/19 0600  ceFEPIme (MAXIPIME) 2 g in sodium  chloride 0.9 % 100 mL IVPB     2 g 200 mL/hr over 30 Minutes Intravenous Every 8 hours 04/26/2019 2225     04/08/2019 2300  remdesivir 200 mg in sodium chloride 0.9 % 250 mL IVPB     200 mg 500 mL/hr over 30 Minutes Intravenous Once 04/20/2019 2206 05/04/2019 2334   04/11/2019 2030  vancomycin (VANCOCIN) 2,500 mg in sodium chloride 0.9 % 500 mL IVPB  Status:  Discontinued     2,500 mg 250 mL/hr over 120 Minutes Intravenous  Once 04/11/2019 1926 04/06/2019 2116   05/02/2019 2000  ceFEPIme (MAXIPIME) 2 g in sodium chloride 0.9 % 100 mL IVPB     2 g 200 mL/hr over 30 Minutes Intravenous  Once 05/03/2019 1926  2112   04/24/2019 2000  metroNIDAZOLE (FLAGYL) IVPB 500 mg     500 mg 100  mL/hr over 60 Minutes Intravenous Every 8 hours 04/21/2019 1926         Time Spent in minutes  30   Lala Lund M.D on 04/07/2019 at 10:36 AM  To page go to [email or web address] - password [email or web address]  [facility] -  Office  [phone]    See all Orders from today for further details    Objective:   Vitals:   04/07/19 0715 04/07/19 0730 04/07/19 0745 04/07/19 0802  BP: 131/79 130/85 132/83 121/68  Pulse: 64 63 65 62  Resp: (!) 35 (!) 35 (!) 35 (!) 35  Temp:      TempSrc:      SpO2: 95% 95% 95% 96%  Weight:      Height:        Wt Readings from Last 3 Encounters:  04/07/19 134.6 kg     Intake/Output Summary (Last 24 hours) at 04/07/2019 1036 Last data filed at 04/07/2019 0700 Gross per 24 hour  Intake 4715.3 ml  Output 1260 ml  Net 3455.3 ml     Physical Exam  Intubated, proned & sedated, ET-OG-Foley inplace [place].AT,  Supple Neck,No JVD, No cervical lymphadenopathy appriciated.  Symmetrical Chest wall movement, Good air movement bilaterally, CTAB RRR,No Gallops, Rubs or new Murmurs, No Parasternal Heave +ve B.Sounds, Abd Soft  No Cyanosis, Clubbing or edema, No new Rash or bruise     Data Review:    CBC  Recent Labs  Lab  2015  04/06/19 0224 04/06/19 0310 04/06/19 0450  04/06/19 1721 04/07/19 0500  WBC 16.9*  --   --  14.7*  --   --  9.6  HGB 12.4   < > 13.3 11.4* 16.0* 11.6* 9.5*  HCT 44.3   < > 39.0 42.5 47.0* 34.0* 35.9*  PLT 191  --   --  192  --   --  152  MCV 94.1  --   --  94.7  --   --  95.7  MCH 26.3  --   --  25.4*  --   --  25.3*  MCHC 28.0*  --   --  26.8*  --   --  26.5*  RDW 17.2*  --   --  17.3*  --   --  17.7*  LYMPHSABS 0.9  --   --  0.8  --   --  0.8  MONOABS 1.2*  --   --  1.1*  --   --  0.5  EOSABS 0.0  --   --  0.0  --   --  0.0  BASOSABS 0.0  --   --  0.0  --   --  0.0   < > = values in this interval not displayed.    Chemistries   Recent Labs  Lab 04/28/2019 2015  04/06/19 0224 04/06/19 0310 04/06/19 0315 04/06/19 0450 04/06/19 1030 04/06/19 1636 04/06/19 1721 04/07/19 0500  NA 153*   < > 154* 154*  --  155*  --   --  151* 142  K 4.8   < > 4.6 4.5  --  4.5  --   --  4.3 3.7  CL 116*  --   --  116*  --   --   --   --   --  110  CO2 26  --   --  30  --   --   --   --   --  24  GLUCOSE 117*  --   --  127*  --   --   --   --   --  415*  BUN 20  --   --  20  --   --   --   --   --  21*  CREATININE 1.47*  --   --  1.20*  --   --   --   --   --  0.78  CALCIUM 7.6*  --   --  7.6*  --   --   --   --   --  6.5*  MG 1.7  --   --   --  1.9  --  2.2 2.1  --  1.8  AST 92*  --   --  100*  --   --   --   --   --  66*  ALT 34  --   --  35  --   --   --   --   --  28  ALKPHOS 65  --   --  58  --   --   --   --   --  50  BILITOT 0.6  --   --  0.4  --   --   --   --   --  0.1*   < > = values in this interval not displayed.   ------------------------------------------------------------------------------------------------------------------ No results for input(s): CHOL, HDL, LDLCALC, TRIG, CHOLHDL, LDLDIRECT in the last 72 hours.  No results found for: HGBA1C ------------------------------------------------------------------------------------------------------------------ No results for input(s): TSH, T4TOTAL, T3FREE, THYROIDAB in  the last 72 hours.  Invalid input(s): FREET3  Cardiac Enzymes No results for input(s): CKMB, TROPONINI, MYOGLOBIN in the last 168 hours.  Invalid input(s): CK ------------------------------------------------------------------------------------------------------------------ No results found for: BNP  Micro Results Recent Results (from the past 240 hour(s))  MRSA PCR Screening     Status: Abnormal   Collection Time: 04/06/19  2:45 AM   Specimen: Nasopharyngeal  Result Value Ref Range Status   MRSA by PCR POSITIVE (A) NEGATIVE Final    Comment:        The GeneXpert MRSA Assay (FDA approved for NASAL specimens only), is one component of a comprehensive MRSA colonization surveillance program. It is not intended to diagnose MRSA infection nor to guide or monitor treatment for MRSA infections. RESULT CALLED TO, READ BACK BY AND VERIFIED WITH: Arta Silence [id number] @ [id number] BY J SCOTTON Performed at [facility] [address] [place], [place] [address]   Culture, respiratory (non-expectorated)     Status: None (Preliminary result)   Collection Time: 04/06/19 11:36 AM   Specimen: Tracheal Aspirate; Respiratory  Result Value Ref Range Status   Specimen Description   Final    TRACHEAL ASPIRATE Performed at [facility] [address], [place], [place] [address]    Special Requests   Final    NONE Performed at [facility], [place] [address] [place], [place] [address]    Gram Stain   Final    ABUNDANT WBC PRESENT,BOTH PMN AND MONONUCLEAR NO ORGANISMS SEEN    Culture   Final    CULTURE REINCUBATED FOR BETTER GROWTH Performed at [facility], [place] [address], [place], [place] [address]    Report Status PENDING  Incomplete    Radiology Reports Ct Head Wo Contrast  Result Date: 04/06/2019 CLINICAL DATA:  48 y/o F; unexplained altered level of consciousness. Found unresponsive. Sepsis and COVID-19 positive. EXAM:  CT HEAD WITHOUT CONTRAST TECHNIQUE:  Contiguous axial images were obtained from the base of the skull through the vertex without intravenous contrast. COMPARISON:  None. FINDINGS: Brain: No evidence of acute infarction, hemorrhage, hydrocephalus, extra-axial collection or mass effect. Coarse calcifications are present which appear to be centered in the region of hypothalamus and mamillary bodies. Vascular: No hyperdense vessel or unexpected calcification. Skull: Normal. Negative for fracture or focal lesion. Sinuses/Orbits: Right maxillary sinus mucosal thickening. Large left maxillary sinus mucous retention cyst or polyp. Additional included paranasal sinuses and the mastoid air cells are normally aerated. Nasoenteric tube and endotracheal tube noted. Orbits are unremarkable. Other: None. IMPRESSION: 1. No acute intracranial abnormality identified. 2. Coarse calcifications which appears centered in the region of hypothalamus and mamillary bodies. Findings may represent a small underlying mass such as craniopharyngioma, glioma, and hamartoma or possibly sequelae of prior toxic/inflammatory process. Consider MRI of the brain without and with contrast to better characterize. Electronically Signed   By: Kristine Garbe M.D.   On: 04/06/2019 02:24   Dg Chest Port 1 View  Result Date: 04/06/2019 CLINICAL DATA:  Shortness of breath. EXAM: PORTABLE CHEST 1 VIEW COMPARISON:  04/06/2019. FINDINGS: Endotracheal tube, NG tube, left IJ line stable position. Stable cardiomegaly. Diffuse severe bilateral pulmonary infiltrates/edema again noted. No prominent pleural effusion. No pneumothorax. IMPRESSION: 1.  Lines and tubes stable position. 2. Stable cardiomegaly. Diffuse severe bilateral pulmonary infiltrates/edema again noted. No interim change. Electronically Signed   By: Marcello Moores  Register   On: 04/06/2019 08:42   Dg Chest Port 1 View  Result Date: 04/06/2019 CLINICAL DATA:  Respiratory failure.  COVID-19 positive.  EXAM: PORTABLE CHEST 1 VIEW COMPARISON:  No recent prior. FINDINGS: Left IJ line noted with tip over cavoatrial junction. NG tube noted with tip below left hemidiaphragm. Cardiomegaly. Diffuse bilateral pulmonary infiltrates/edema. Tiny left effusion cannot be excluded. No pneumothorax. IMPRESSION: 1. Left IJ line noted with tip over cavoatrial junction. NG tube noted with tip below left hemidiaphragm. 2.  Cardiomegaly. 3. Severe diffuse bilateral pulmonary infiltrates/edema. Tiny left pleural effusion cannot be excluded. Electronically Signed   By: Marcello Moores  Register   On: 04/06/2019 07:36   Dg Abd Portable 1v  Result Date: 04/15/2019 CLINICAL DATA:  Check gastric catheter placement EXAM: PORTABLE ABDOMEN - 1 VIEW COMPARISON:  None. FINDINGS: Scattered large and small bowel gas is noted. Gastric catheter is noted within the distal aspect of the stomach. Patchy infiltrates are noted within the lungs bilaterally. IMPRESSION: Gastric catheter within the stomach. Electronically Signed   By: Inez Catalina M.D.   On: 04/15/2019 21:55

## 2019-04-07 NOTE — Progress Notes (Signed)
Received order for PICC.  At present has TL CVC.  Plan to place tomorrow.

## 2019-04-07 NOTE — Procedures (Signed)
Arterial Catheter Insertion Procedure Note Christine Patterson [id number] 01-29-1971  Procedure: Insertion of Arterial Catheter  Indications: Blood pressure monitoring  Procedure Details Consent: Risks of procedure as well as the alternatives and risks of each were explained to the (patient/caregiver).  Consent for procedure obtained. Time Out: Verified patient identification, verified procedure, site/side was marked, verified correct patient position, special equipment/implants available, medications/allergies/relevent history reviewed, required imaging and test results available.  Performed  Maximum sterile technique was used including antiseptics, cap, gloves, gown, hand hygiene, mask and sheet. Skin prep: Chlorhexidine; local anesthetic administered 20 gauge catheter was inserted into left radial artery using the Seldinger technique. ULTRASOUND GUIDANCE USED: NO Evaluation Blood flow good; BP tracing good. Complications: No apparent complications.   Phillis Knack [facility] 04/07/2019

## 2019-04-07 NOTE — Progress Notes (Signed)
Pharmacy Antibiotic Note  Christine Patterson is a 48 y.o. female admitted on 04-18-2019 after being found unresponsive with sepsis, COVID-19. CTA chest shows bilateral atypical PNA so started broad spectrum abx. Pt required intubation. Pharmacy has been consulted for Vancomycin and Cefepime dosing.  SCr improving  Plan: Continue Cefepime 2gm IV q8h Continue Metronidazole 500mg  IV q8h Increase to Vancomycin 2000 mg IV Q 24 hrs.  Goal AUC 400-550.  Expected AUC: 498 using SCr 0.8.  Vd coeff: 0.5 Will f/u renal function, micro data, and pt's clinical condition Vanc levels prn  Height: 5\' 2"  (157.5 cm) Weight: 296 lb 12.8 oz (134.6 kg) IBW/kg (Calculated) : 50.1  Temp (24hrs), Avg:98 F (36.7 C), Min:97.4 F (36.3 C), Max:98.6 F (37 C)  Recent Labs  Lab 04/18/19 2015 04/18/19 2250 04/06/19 0310 04/07/19 0500  WBC 16.9*  --  14.7* 9.6  CREATININE 1.47*  --  1.20* 0.78  LATICACIDVEN 1.4 1.2  --   --     Estimated Creatinine Clearance: 111.4 mL/min (by C-G formula based on SCr of 0.78 mg/dL).    No Known Allergies  Antimicrobials this admission: 7/1 Remdesivir >> 7/5 7/1 Vanc >> 7/1 Cefepime >> 7/1 Flagyl >>  Dose adjustments this admission: 7/3 empirically adjusted vanc for renal function  Microbiology results:  [place], updated on 7/3: 6/30 BCx: 1/4 bottles CNS 6/30 UCx: >100k Klebsiella pneumoniae (resist amp, bactrim.  Sens: cefazolin, gent, tob, LVQ, Imipenem)  [place]: 7/2 MRSA PCR: + 7/2 TA: reincubated  Thank you for allowing pharmacy to be a part of this patient's care.  Gretta Arab PharmD, BCPS Clinical pharmacist phone 7am- 5pm: [phone] 04/07/2019 11:35 AM

## 2019-04-08 ENCOUNTER — Inpatient Hospital Stay (HOSPITAL_COMMUNITY): Payer: Medicaid Other

## 2019-04-08 LAB — GLUCOSE, CAPILLARY
Glucose-Capillary: 107 mg/dL — ABNORMAL HIGH (ref 70–99)
Glucose-Capillary: 113 mg/dL — ABNORMAL HIGH (ref 70–99)
Glucose-Capillary: 126 mg/dL — ABNORMAL HIGH (ref 70–99)
Glucose-Capillary: 142 mg/dL — ABNORMAL HIGH (ref 70–99)
Glucose-Capillary: 146 mg/dL — ABNORMAL HIGH (ref 70–99)
Glucose-Capillary: 97 mg/dL (ref 70–99)

## 2019-04-08 LAB — COMPREHENSIVE METABOLIC PANEL
ALT: 29 U/L (ref 0–44)
AST: 51 U/L — ABNORMAL HIGH (ref 15–41)
Albumin: 2.2 g/dL — ABNORMAL LOW (ref 3.5–5.0)
Alkaline Phosphatase: 58 U/L (ref 38–126)
Anion gap: 6 (ref 5–15)
BUN: 27 mg/dL — ABNORMAL HIGH (ref 6–20)
CO2: 28 mmol/L (ref 22–32)
Calcium: 7.4 mg/dL — ABNORMAL LOW (ref 8.9–10.3)
Chloride: 118 mmol/L — ABNORMAL HIGH (ref 98–111)
Creatinine, Ser: 0.76 mg/dL (ref 0.44–1.00)
GFR calc Af Amer: >60 mL/min (ref >60)
GFR calc non Af Amer: >60 mL/min (ref >60)
Glucose, Bld: 154 mg/dL — ABNORMAL HIGH (ref 70–99)
Potassium: 3.9 mmol/L (ref 3.5–5.1)
Sodium: 152 mmol/L — ABNORMAL HIGH (ref 135–145)
Total Bilirubin: 0.7 mg/dL (ref 0.3–1.2)
Total Protein: 6.4 g/dL — ABNORMAL LOW (ref 6.5–8.1)

## 2019-04-08 LAB — POCT I-STAT 7, (LYTES, BLD GAS, ICA,H+H)
Acid-base deficit: 7 mmol/L — ABNORMAL HIGH (ref 0.0–2.0)
Acid-base deficit: 2 mmol/L (ref 0.0–2.0)
Bicarbonate: 23.4 mmol/L (ref 20.0–28.0)
Bicarbonate: 25.6 mmol/L (ref 20.0–28.0)
Calcium, Ion: 1.14 mmol/L — ABNORMAL LOW (ref 1.15–1.40)
Calcium, Ion: 1.14 mmol/L — ABNORMAL LOW (ref 1.15–1.40)
HCT: 38 % (ref 36.0–46.0)
HCT: 33 % — ABNORMAL LOW (ref 36.0–46.0)
Hemoglobin: 12.9 g/dL (ref 12.0–15.0)
Hemoglobin: 11.2 g/dL — ABNORMAL LOW (ref 12.0–15.0)
O2 Saturation: 45 %
O2 Saturation: 91 %
Patient temperature: 36.2
Potassium: 3.8 mmol/L (ref 3.5–5.1)
Potassium: 3.6 mmol/L (ref 3.5–5.1)
Sodium: 155 mmol/L — ABNORMAL HIGH (ref 135–145)
Sodium: 156 mmol/L — ABNORMAL HIGH (ref 135–145)
TCO2: 26 mmol/L (ref 22–32)
TCO2: 27 mmol/L (ref 22–32)
pCO2 arterial: 69.4 mmHg (ref 32.0–48.0)
pCO2 arterial: 57.1 mmHg — ABNORMAL HIGH (ref 32.0–48.0)
pH, Arterial: 7.131 — CL (ref 7.350–7.450)
pH, Arterial: 7.26 — ABNORMAL LOW (ref 7.350–7.450)
pO2, Arterial: 32 mmHg — CL (ref 83.0–108.0)
pO2, Arterial: 71 mmHg — ABNORMAL LOW (ref 83.0–108.0)

## 2019-04-08 LAB — CBC WITH DIFFERENTIAL/PLATELET
Abs Immature Granulocytes: 0.21 10*3/uL — ABNORMAL HIGH (ref 0.00–0.07)
Basophils Absolute: 0 10*3/uL (ref 0.0–0.1)
Basophils Relative: 0 %
Eosinophils Absolute: 0 10*3/uL (ref 0.0–0.5)
Eosinophils Relative: 0 %
HCT: 38.5 % (ref 36.0–46.0)
Hemoglobin: 10.2 g/dL — ABNORMAL LOW (ref 12.0–15.0)
Immature Granulocytes: 3 %
Lymphocytes Relative: 11 %
Lymphs Abs: 0.9 10*3/uL (ref 0.7–4.0)
MCH: 25.1 pg — ABNORMAL LOW (ref 26.0–34.0)
MCHC: 26.5 g/dL — ABNORMAL LOW (ref 30.0–36.0)
MCV: 94.6 fL (ref 80.0–100.0)
Monocytes Absolute: 0.5 10*3/uL (ref 0.1–1.0)
Monocytes Relative: 6 %
Neutro Abs: 6.6 10*3/uL (ref 1.7–7.7)
Neutrophils Relative %: 80 %
Platelets: 171 10*3/uL (ref 150–400)
RBC: 4.07 MIL/uL (ref 3.87–5.11)
RDW: 17.8 % — ABNORMAL HIGH (ref 11.5–15.5)
WBC: 8.2 10*3/uL (ref 4.0–10.5)
nRBC: 1.7 % — ABNORMAL HIGH (ref 0.0–0.2)

## 2019-04-08 LAB — PHOSPHORUS: Phosphorus: 1.7 mg/dL — ABNORMAL LOW (ref 2.5–4.6)

## 2019-04-08 LAB — C-REACTIVE PROTEIN: CRP: 10.6 mg/dL — ABNORMAL HIGH (ref <1.0)

## 2019-04-08 LAB — PROCALCITONIN: Procalcitonin: 0.68 ng/mL

## 2019-04-08 LAB — MAGNESIUM: Magnesium: 2.5 mg/dL — ABNORMAL HIGH (ref 1.7–2.4)

## 2019-04-08 LAB — FERRITIN: Ferritin: 202 ng/mL (ref 11–307)

## 2019-04-08 LAB — D-DIMER, QUANTITATIVE: D-Dimer, Quant: 2.43 ug/mL-FEU — ABNORMAL HIGH (ref 0.00–0.50)

## 2019-04-08 MED ORDER — SODIUM CHLORIDE 0.9% FLUSH
10.0000 mL | Freq: Two times a day (BID) | INTRAVENOUS | Status: DC
  Administered 2019-04-10 – 2019-04-13 (×6): 10 mL

## 2019-04-08 MED ORDER — SODIUM CHLORIDE 0.9% FLUSH: 10.0000 mL | INTRAVENOUS | Status: DC | PRN

## 2019-04-08 MED ORDER — HYDROCORTISONE NA SUCCINATE PF 100 MG IJ SOLR
50.0000 mg | Freq: Three times a day (TID) | INTRAMUSCULAR | Status: DC
  Administered 2019-04-08 – 2019-04-13 (×15): 50 mg via INTRAVENOUS
  Filled 2019-04-08 (×15): qty 2

## 2019-04-08 MED ORDER — SODIUM CHLORIDE 0.9% FLUSH
10.0000 mL | Freq: Two times a day (BID) | INTRAVENOUS | Status: DC
  Administered 2019-04-08: 10 mL

## 2019-04-08 MED ORDER — K PHOS MONO-SOD PHOS DI & MONO 155-852-130 MG PO TABS
500.0000 mg | ORAL_TABLET | Freq: Four times a day (QID) | ORAL | Status: AC
  Administered 2019-04-08 (×3): 500 mg
  Filled 2019-04-08 (×3): qty 2

## 2019-04-08 MED ORDER — SODIUM CHLORIDE 3 % IN NEBU
4.0000 mL | INHALATION_SOLUTION | Freq: Two times a day (BID) | RESPIRATORY_TRACT | Status: AC
  Administered 2019-04-08 – 2019-04-10 (×6): 4 mL via RESPIRATORY_TRACT
  Filled 2019-04-08 (×8): qty 4

## 2019-04-08 MED ORDER — ENOXAPARIN SODIUM 150 MG/ML ~~LOC~~ SOLN
140.0000 mg | Freq: Two times a day (BID) | SUBCUTANEOUS | Status: DC
  Administered 2019-04-08 – 2019-04-12 (×9): 140 mg via SUBCUTANEOUS
  Filled 2019-04-08 (×12): qty 0.93

## 2019-04-08 MED ORDER — ENOXAPARIN SODIUM 80 MG/0.8ML ~~LOC~~ SOLN
70.0000 mg | Freq: Once | SUBCUTANEOUS | Status: AC
  Administered 2019-04-08: 70 mg via SUBCUTANEOUS
  Filled 2019-04-08: qty 0.8

## 2019-04-08 MED ORDER — SODIUM BICARBONATE 8.4 % IV SOLN
INTRAVENOUS | Status: AC
  Filled 2019-04-08: qty 50

## 2019-04-08 MED ORDER — DESMOPRESSIN ACETATE 0.2 MG PO TABS
0.2000 mg | ORAL_TABLET | Freq: Every day | ORAL | Status: DC
  Administered 2019-04-08: 17:00:00 0.2 mg
  Filled 2019-04-08 (×2): qty 1

## 2019-04-08 MED ORDER — CHLORHEXIDINE GLUCONATE CLOTH 2 % EX PADS: 6.0000 | MEDICATED_PAD | Freq: Every day | CUTANEOUS | Status: DC

## 2019-04-08 MED ORDER — EPINEPHRINE 1 MG/10ML IJ SOSY
PREFILLED_SYRINGE | INTRAMUSCULAR | Status: AC
  Filled 2019-04-08: qty 10

## 2019-04-08 MED ORDER — CHLORHEXIDINE GLUCONATE CLOTH 2 % EX PADS
6.0000 | MEDICATED_PAD | Freq: Every day | CUTANEOUS | Status: DC
  Administered 2019-04-08 – 2019-04-13 (×7): 6 via TOPICAL

## 2019-04-08 MED ORDER — DOPAMINE-DEXTROSE 3.2-5 MG/ML-% IV SOLN
0.0000 ug/kg/min | INTRAVENOUS | Status: DC
  Administered 2019-04-08: 5 ug/kg/min via INTRAVENOUS
  Filled 2019-04-08: qty 250

## 2019-04-08 MED ORDER — GUAIFENESIN 100 MG/5ML PO SOLN
15.0000 mL | Freq: Four times a day (QID) | ORAL | Status: DC
  Administered 2019-04-08 – 2019-04-13 (×19): 300 mg via ORAL
  Filled 2019-04-08 (×5): qty 15
  Filled 2019-04-08: qty 45
  Filled 2019-04-08 (×12): qty 15

## 2019-04-08 NOTE — Progress Notes (Signed)
Peripherally Inserted Central Catheter/Midline Placement  The IV Nurse has discussed with the patient and/or persons authorized to consent for the patient, the purpose of this procedure and the potential benefits and risks involved with this procedure.  The benefits include less needle sticks, lab draws from the catheter, and the patient may be discharged home with the catheter. Risks include, but not limited to, infection, bleeding, blood clot (thrombus formation), and puncture of an artery; nerve damage and irregular heartbeat and possibility to perform a PICC exchange if needed/ordered by physician.  Alternatives to this procedure were also discussed.  Bard Power PICC patient education guide, fact sheet on infection prevention and patient information card has been provided to patient /or left at bedside.    PICC/Midline Placement Documentation  PICC Double Lumen 04/08/19 PICC Right Cephalic 41 cm (Active)  Indication for Insertion or Continuance of Line Prolonged intravenous therapies 04/08/19 1100  Site Assessment Clean;Dry;Intact 04/08/19 1100  Lumen #1 Status Flushed;Blood return noted 04/08/19 1100  Lumen #2 Status Flushed;Blood return noted 04/08/19 1100  Dressing Type Transparent 04/08/19 1100  Dressing Status Clean;Dry;Intact;Antimicrobial disc in place 04/08/19 1100  Dressing Change Due 04/15/19 04/08/19 1100    Telephone consent   Jule Economy Horton 04/08/2019, 11:25 AM

## 2019-04-08 NOTE — Progress Notes (Signed)
RT NOTE:  Pt in prone position. RT assisted RN with head turn toRight. Oral care provided. ETT secured throughout @ 22cm/lip 

## 2019-04-08 NOTE — Progress Notes (Signed)
Unable to do video chat with family

## 2019-04-08 NOTE — Progress Notes (Signed)
Started Levophed at 37mcg/min for SBP < 90 by art line. HR was in the mid 40s, which dropped to 30 after initiation of Levo, so it was stopped. SBP dropped below 90 ~ 10 minutes later, so levo was restarted at 60mcg/min. Bradycardia again developed shortly afterwards x2. MD notified. Orders received for Dopamine infusion. Shortly after starting dopamine at 5 mcg/min, SBP was in the 160s and HR was tachy and irregular. Dopamine stopped and restarted 2 more times with the same results. [place] notified. Dopamine is currently infusing at 0.31mcg/min. HR is irregular in the 60s, SBP 120s.

## 2019-04-08 NOTE — Progress Notes (Addendum)
PROGRESS NOTE                                                                                                                                                                                                             Patient Demographics:    Christine Patterson, is a 48 y.o. female, DOB - [birth year], ZOX:[id number]  Outpatient Primary MD for the patient is System, Pcp Not In    LOS - 3  No chief complaint on file.      Brief Narrative: Patient is a 48 y.o. 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   Subjective:    Christine Patterson today remains intubated and sedated.  She was proned at this morning during rounds.   Assessment  & Plan :   Acute Hypoxic Resp Failure with ARDS due to Covid 19 Viral pneumonia and suspected superimposed aspiration pneumonitis: Continue full ventilator support per PCCM he remains on significant amount of FiO2.  On IV steroids and Remdesivir.  Due to concern for aspiration pneumonia-and the fact that she is already on Biologics (infliximab)-Actemra was held.  All cultures negative-stop Flagyl-continue Vanco/cefepime-if clinical improvement continues-suspect we could discontinue all antibiotics over the next few days.  Inflammatory markers continue to slowly downtrend.  She has multiple comorbidities at baseline including pulmonary and neurosarcoidosis-hence given severity of her COVID-19 pneumonia with ARDS-her prognosis is probably very poor.   COVID-19 Labs:  Recent Labs    04/06/19 0310 04/06/19 0315 04/07/19 0500 04/07/19 0954 04/08/19 0500  DDIMER 4.47*  --   --  2.23* 2.43*  FERRITIN  --  299 245  --  202  CRP  --  33.6* 18.1*  --  10.6*    No results found for: SARSCOV2NAA   COVID-19 Medications: 7/1>> Solu-Medrol 7/1>> Remdesivir   Vent Settings: Vent Mode: PRVC FiO2 (%):  [50 %-80 %] 50 % Set Rate:  [35 bmp] 35 bmp Vt Set:  [400 mL] 400 mL PEEP:  [14 cmH20-16 cmH20] 14 cmH20 Plateau Pressure:  [  25 cmH20-29 cmH20] 28 cmH20  ABG:    Component Value Date/Time   PHART 7.342 (L) 04/07/2019 1622   PCO2ART 48.9 (H) 04/07/2019 1622   PO2ART 63.0 (L) 04/07/2019 1622   HCO3 26.7 04/07/2019 1622   TCO2 28 04/07/2019 1622   O2SAT 91.0 04/07/2019 1622    Septic shock secondary to presumed aspiration pneumonia and COVID-19: Sepsis pathophysiology is resolving-no longer on pressors.  Sputum culture negative to date.  Continue Vanco/cefepime and Remdesivir-stopping Flagyl today.  Acute metabolic and toxic encephalopathy: Unable to assess mental status as patient is on sedation.  She has neurosarcoidosis at baseline-once her sedation needs have decreased significantly-we can then assess for any neurological improvement.  Given the fact that she had profound hypoxemia and agonal respiration when EMS arrived-she is obviously at risk of some anoxic injury.  CT head on 7/2 without any acute abnormalities.  EEG ordered but currently pending  Transaminitis: Secondary to COVID-19-mild and downtrending.  Follow periodically.  Hypernatremia: Has DI as part of panhypopituitarism-restart DDAVP now that patient no longer on vasopressin infusion.  Ensure appropriate free water.  Check electrolytes in a.m.  Panhypopituitarism: Continue steroids-but suspect we can now taper slowly, continue levothyroxine-adding DDAVP.  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  DM-2: CBG stable with SSI  OSA on CPAP at home  Condition - Extremely Guarded-as noted above-prognosis is poor at this point.  Family Communication  :  PCCM will update family  Code Status :  Full Code  Diet :  Diet Order            Diet NPO time specified  Diet effective now               Disposition Plan  :  Remain inpatient  Consults  :  PCCM  Procedures  :    ETT>> 7/1 L IJ CVL 7/1  GI prophylaxis: H2 Blocker  DVT Prophylaxis  :  Lovenox BID dosing (addendum-due to worsening hypoxemia-after discussion with PCCM-changed Lovenox to therapeutic dosing)  Lab Results  Component Value Date   PLT 171 04/08/2019    Inpatient Medications  Scheduled Meds: . artificial tears  1 application Both Eyes A1P  . aspirin  81 mg Per Tube Daily  . atorvastatin  40 mg Per Tube q1800  . chlorhexidine gluconate (MEDLINE KIT)  15 mL Mouth Rinse BID  . Chlorhexidine Gluconate Cloth  6 each Topical Daily  . desmopressin  0.2 mg Oral Daily  . enoxaparin (LOVENOX) injection  70 mg Subcutaneous Q12H  . famotidine  20 mg Per Tube BID  . feeding supplement (PRO-STAT SUGAR FREE 64)  30 mL Per Tube Daily  . free water  250 mL Per Tube Q6H  . hydrocortisone sod succinate (SOLU-CORTEF) inj  100 mg Intravenous Q6H  . insulin aspart  0-9 Units Subcutaneous Q4H  . levothyroxine  125 mcg Per Tube Q0600  . mouth rinse  15 mL Mouth Rinse 10 times per day  . mupirocin ointment  1 application Nasal BID  . vitamin C  500 mg Per Tube Daily  . zinc sulfate  220 mg Per Tube Daily   Continuous Infusions: . sodium chloride 10 mL/hr at 04/08/19 1000  . ceFEPime (MAXIPIME) IV Stopped (04/08/19 3790)  . feeding supplement (VITAL HIGH PROTEIN) 20 mL/hr at 04/08/19 0230  . fentaNYL infusion INTRAVENOUS 200 mcg/hr (04/08/19 1000)  . metronidazole Stopped (04/08/19 0538)  . midazolam 4 mg/hr (04/08/19 1000)  . norepinephrine (LEVOPHED)  Adult infusion Stopped (04/07/19 1916)  .  remdesivir 100 mg in NS 250 mL Stopped (04/07/19 2238)  . vancomycin Stopped (04/07/19 1404)  . vasopressin (PITRESSIN) infusion - *FOR SHOCK* Stopped (04/08/19 0839)   PRN Meds:.chlorpheniramine-HYDROcodone, fentaNYL, guaiFENesin-dextromethorphan, midazolam **OR** midazolam, vecuronium  Antibiotics  :    Anti-infectives (From admission, onward)   Start     Dose/Rate Route Frequency Ordered Stop   04/07/19 1200  vancomycin (VANCOCIN) 2,000 mg in sodium chloride 0.9 % 500 mL IVPB     2,000 mg 250 mL/hr over 120 Minutes Intravenous Every 24 hours 04/07/19 1126     04/06/19 2200  remdesivir 100 mg in sodium chloride 0.9 % 250 mL IVPB     100 mg 500 mL/hr over 30 Minutes Intravenous Every 24 hours 04/07/2019 2206 04/10/19 2159   04/06/19 0800  vancomycin (VANCOCIN) 1,250 mg in sodium chloride 0.9 % 250 mL IVPB  Status:  Discontinued     1,250 mg 166.7 mL/hr over 90 Minutes Intravenous Every 24 hours 04/10/2019 2225 04/07/19 1126   04/06/19 0600  ceFEPIme (MAXIPIME) 2 g in sodium chloride 0.9 % 100 mL IVPB     2 g 200 mL/hr over 30 Minutes Intravenous Every 8 hours 04/16/2019 2225     04/09/2019 2300  remdesivir 200 mg in sodium chloride 0.9 % 250 mL IVPB     200 mg 500 mL/hr over 30 Minutes Intravenous Once 04/21/2019 2206 05/05/2019 2334   04/12/2019 2030  vancomycin (VANCOCIN) 2,500 mg in sodium chloride 0.9 % 500 mL IVPB  Status:  Discontinued     2,500 mg 250 mL/hr over 120 Minutes Intravenous  Once 04/25/2019 1926 04/30/2019 2116   04/12/2019 2000  ceFEPIme (MAXIPIME) 2 g in sodium chloride 0.9 % 100 mL IVPB     2 g 200 mL/hr over 30 Minutes Intravenous  Once 04/09/2019 1926 04/23/2019 2112   04/19/2019 2000  metroNIDAZOLE (FLAGYL) IVPB 500 mg     500 mg 100 mL/hr over 60 Minutes Intravenous Every 8 hours 04/28/2019 1926         Time Spent in minutes  45   The patient is critically ill with multiple organ system failure and requires high complexity decision making for assessment and support, frequent  evaluation and titration of therapies, advanced monitoring, review of radiographic studies and interpretation of complex data.    Oren Binet M.D on 04/08/2019 at 11:14 AM  To page go to [email or web address] - use universal password  [facility] -  Office  [phone]  See all Orders from today for further details   Admit date - 04/28/2019    3    Objective:   Vitals:   04/08/19 0715 04/08/19 0800 04/08/19 0900 04/08/19 1000  BP: 115/79 124/78 113/68   Pulse: (!) 46 (!) 44 (!) 57 66  Resp: (!) 35 (!) 35 (!) 35 (!) 35  Temp:  (!) 97.5 F (36.4 C)    TempSrc:  Axillary    SpO2: 94% 93% 92% 93%  Weight:      Height:        Wt Readings from Last 3 Encounters:  04/08/19 (!) 141.4 kg     Intake/Output Summary (Last 24 hours) at 04/08/2019 1114 Last data filed at 04/08/2019 1000 Gross per 24 hour  Intake 3376.51 ml  Output 1495 ml  Net 1881.51 ml     Physical Exam Gen Exam:Intubated, Sedated-not in any distress HEENT:atraumatic, normocephalic Chest: B/L clear to auscultation anteriorly CVS:S1S2 regular Abdomen:soft non tender,  non distended Extremities:no edema Neurology: difficult to examine given sedation/intubation Skin: no rash   Data Review:    CBC Recent Labs  Lab 04/11/2019 2015  04/06/19 0310 04/06/19 0450 04/06/19 1721 04/07/19 0500 04/07/19 1622 04/08/19 0500  WBC 16.9*  --  14.7*  --   --  9.6  --  8.2  HGB 12.4   < > 11.4* 16.0* 11.6* 9.5* 11.2* 10.2*  HCT 44.3   < > 42.5 47.0* 34.0* 35.9* 33.0* 38.5  PLT 191  --  192  --   --  152  --  171  MCV 94.1  --  94.7  --   --  95.7  --  94.6  MCH 26.3  --  25.4*  --   --  25.3*  --  25.1*  MCHC 28.0*  --  26.8*  --   --  26.5*  --  26.5*  RDW 17.2*  --  17.3*  --   --  17.7*  --  17.8*  LYMPHSABS 0.9  --  0.8  --   --  0.8  --  0.9  MONOABS 1.2*  --  1.1*  --   --  0.5  --  0.5  EOSABS 0.0  --  0.0  --   --  0.0  --  0.0  BASOSABS 0.0  --  0.0  --   --  0.0  --  0.0   < > = values in this  interval not displayed.    Chemistries  Recent Labs  Lab 04/21/2019 2015  04/06/19 0310  04/06/19 0450 04/06/19 1030 04/06/19 1636 04/06/19 1721 04/07/19 0500 04/07/19 1622 04/07/19 1719 04/08/19 0500  NA 153*   < > 154*  --  155*  --   --  151* 142 151*  --  152*  K 4.8   < > 4.5  --  4.5  --   --  4.3 3.7 4.1  --  3.9  CL 116*  --  116*  --   --   --   --   --  110  --   --  118*  CO2 26  --  30  --   --   --   --   --  24  --   --  28  GLUCOSE 117*  --  127*  --   --   --   --   --  415*  --   --  154*  BUN 20  --  20  --   --   --   --   --  21*  --   --  27*  CREATININE 1.47*  --  1.20*  --   --   --   --   --  0.78  --   --  0.76  CALCIUM 7.6*  --  7.6*  --   --   --   --   --  6.5*  --   --  7.4*  MG 1.7  --   --    < >  --  2.2 2.1  --  1.8  --  2.3 2.5*  AST 92*  --  100*  --   --   --   --   --  66*  --   --  51*  ALT 34  --  35  --   --   --   --   --  28  --   --  29  ALKPHOS 65  --  58  --   --   --   --   --  50  --   --  58  BILITOT 0.6  --  0.4  --   --   --   --   --  0.1*  --   --  0.7   < > = values in this interval not displayed.   ------------------------------------------------------------------------------------------------------------------ No results for input(s): CHOL, HDL, LDLCALC, TRIG, CHOLHDL, LDLDIRECT in the last 72 hours.  No results found for: HGBA1C ------------------------------------------------------------------------------------------------------------------ No results for input(s): TSH, T4TOTAL, T3FREE, THYROIDAB in the last 72 hours.  Invalid input(s): FREET3 ------------------------------------------------------------------------------------------------------------------ Recent Labs    04/07/19 0500 04/08/19 0500  FERRITIN 245 202    Coagulation profile Recent Labs  Lab 05/03/2019 2015  INR 1.2    Recent Labs    04/07/19 0954 04/08/19 0500  DDIMER 2.23* 2.43*    Cardiac Enzymes No results for input(s): CKMB,  TROPONINI, MYOGLOBIN in the last 168 hours.  Invalid input(s): CK ------------------------------------------------------------------------------------------------------------------ No results found for: BNP  Micro Results Recent Results (from the past 240 hour(s))  MRSA PCR Screening     Status: Abnormal   Collection Time: 04/06/19  2:45 AM   Specimen: Nasopharyngeal  Result Value Ref Range Status   MRSA by PCR POSITIVE (A) NEGATIVE Final    Comment:        The GeneXpert MRSA Assay (FDA approved for NASAL specimens only), is one component of a comprehensive MRSA colonization surveillance program. It is not intended to diagnose MRSA infection nor to guide or monitor treatment for MRSA infections. RESULT CALLED TO, READ BACK BY AND VERIFIED WITH: Arta Silence [id number] @ 4431 BY J SCOTTON Performed at [facility] [address] [place], [place] [address]   Culture, respiratory (non-expectorated)     Status: None (Preliminary result)   Collection Time: 04/06/19 11:36 AM   Specimen: Tracheal Aspirate; Respiratory  Result Value Ref Range Status   Specimen Description   Final    TRACHEAL ASPIRATE Performed at [facility] [address], [place], [place] [address]    Special Requests   Final    NONE Performed at [facility], [place] [address], [place], [place] [address]    Gram Stain   Final    ABUNDANT WBC PRESENT,BOTH PMN AND MONONUCLEAR NO ORGANISMS SEEN    Culture   Final    CULTURE REINCUBATED FOR BETTER GROWTH Performed at [facility], [place] [address], [place], [place] [address]    Report Status PENDING  Incomplete    Radiology Reports Ct Head Wo Contrast  Result Date: 04/06/2019 CLINICAL DATA:  48 y/o F; unexplained altered level of consciousness. Found unresponsive. Sepsis and COVID-19 positive. EXAM: CT HEAD WITHOUT CONTRAST TECHNIQUE: Contiguous axial images were obtained from the base of the  skull through the vertex without intravenous contrast. COMPARISON:  None. FINDINGS: Brain: No evidence of acute infarction, hemorrhage, hydrocephalus, extra-axial collection or mass effect. Coarse calcifications are present which appear to be centered in the region of hypothalamus and mamillary bodies. Vascular: No hyperdense vessel or unexpected calcification. Skull: Normal. Negative for fracture or focal lesion. Sinuses/Orbits: Right maxillary sinus mucosal thickening. Large left maxillary sinus mucous retention cyst or polyp. Additional included paranasal sinuses and the mastoid air cells are normally aerated. Nasoenteric tube and endotracheal tube noted. Orbits are unremarkable. Other: None. IMPRESSION: 1. No acute intracranial abnormality identified. 2. Coarse calcifications  which appears centered in the region of hypothalamus and mamillary bodies. Findings may represent a small underlying mass such as craniopharyngioma, glioma, and hamartoma or possibly sequelae of prior toxic/inflammatory process. Consider MRI of the brain without and with contrast to better characterize. Electronically Signed   By: Kristine Garbe M.D.   On: 04/06/2019 02:24   Dg Chest Port 1 View  Result Date: 04/06/2019 CLINICAL DATA:  Shortness of breath. EXAM: PORTABLE CHEST 1 VIEW COMPARISON:  04/06/2019. FINDINGS: Endotracheal tube, NG tube, left IJ line stable position. Stable cardiomegaly. Diffuse severe bilateral pulmonary infiltrates/edema again noted. No prominent pleural effusion. No pneumothorax. IMPRESSION: 1.  Lines and tubes stable position. 2. Stable cardiomegaly. Diffuse severe bilateral pulmonary infiltrates/edema again noted. No interim change. Electronically Signed   By: Marcello Moores  Register   On: 04/06/2019 08:42   Dg Chest Port 1 View  Result Date: 04/06/2019 CLINICAL DATA:  Respiratory failure.  COVID-19 positive. EXAM: PORTABLE CHEST 1 VIEW COMPARISON:  No recent prior. FINDINGS: Left IJ line noted with  tip over cavoatrial junction. NG tube noted with tip below left hemidiaphragm. Cardiomegaly. Diffuse bilateral pulmonary infiltrates/edema. Tiny left effusion cannot be excluded. No pneumothorax. IMPRESSION: 1. Left IJ line noted with tip over cavoatrial junction. NG tube noted with tip below left hemidiaphragm. 2.  Cardiomegaly. 3. Severe diffuse bilateral pulmonary infiltrates/edema. Tiny left pleural effusion cannot be excluded. Electronically Signed   By: Marcello Moores  Register   On: 04/06/2019 07:36   Dg Abd Portable 1v  Result Date: 04/09/2019 CLINICAL DATA:  Check gastric catheter placement EXAM: PORTABLE ABDOMEN - 1 VIEW COMPARISON:  None. FINDINGS: Scattered large and small bowel gas is noted. Gastric catheter is noted within the distal aspect of the stomach. Patchy infiltrates are noted within the lungs bilaterally. IMPRESSION: Gastric catheter within the stomach. Electronically Signed   By: Inez Catalina M.D.   On: 04/25/2019 21:55   [place] Ekg Site Rite  Result Date: 04/07/2019 If Site Rite image not attached, placement could not be confirmed due to current cardiac rhythm.

## 2019-04-08 NOTE — Progress Notes (Signed)
Spoke with [employer].Wanted to facetime pt, RN attempted from ICU cell phone as well as elink attempted x2.RN called pt to explain there may be a connection issue. Family expressed understanding, no other phone to facetime with at this time.

## 2019-04-08 NOTE — Progress Notes (Signed)
NAME:  Christine Patterson, MRN:  [id number], DOB:  [birth year], LOS: 3 ADMISSION DATE:  , CONSULTATION DATE:  7/2 REFERRING MD:  Dr. Thedore MinsSingh, CHIEF COMPLAINT:  dyspnea  Brief History   48 y/o female with a complex medical history admitted for ARDS from COVID pneumonia.  There was also concern for aspiration pneumonia on admission.    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, no pulmonary involvement initially; worsening symptoms 2019, started on infliximab; 2020 HRCT showed findings worrisome for pulmonary parenchymal disease from sarcoidosis, likely pulmonary hypertension Prior tobacco abuse  Significant Hospital Events    7/2 admission, placed in prone position 7/3 > prone July 4 > mucus plug after moving to supine position, recurrent svere hypoxemia, emergent bronchoscopy showed thick mucus in left lower lobe, emergent bedside echo showed normal LV contractility, no pericardial fluid, dilated, hypocontractile RV  Consults:  PCCM  Procedures:  7/1 ETT >  7/2 L IJ CVL >  7/4 Emergent bronchoscopy: mucus plugging left lower lobe, emergent bedside echo:  normal LV contractility, no pericardial fluid, dilated, hypocontractile RV  Significant Diagnostic Tests:  12/2018 HRCT showed findings worrisome for pulmonary parenchymal disease from sarcoidosis, likely pulmonary hypertension 11/2017 TTE> moderate LVH, LVEF normal, RV enlarted, hypocontractile free wall, mild global decrease, RVSP 38 mmHg; atrial shunting noted  Micro Data:  6/30 SARS-COV-2> positive 6/30 blood culture OSH > 1/4 coag neg staph 7/1 urine culture > klebsiella 7/2 resp culture   Antimicrobials:  7/1 cefepime>  7/1 flagyl > 7/4 7/1 remdesivir >  7/1vancomycin >  7/1 hydrocortisone >   Interim history/subjective:   Prone  overnight July 4 > mucus plug after moving to supine position  Severe acute hypoxemia again later  Emergent bronchoscopy  Objective   Blood pressure 132/72, pulse (!) 53, temperature (!) 97.4 F (36.3 C), temperature source Axillary, resp. rate (!) 23, height 5\' 2"  (1.575 m), weight (!) 141.4 kg, SpO2 94 %.    Vent Mode: PRVC FiO2 (%):  [50 %-80 %] 50 % Set Rate:  [35 bmp] 35 bmp Vt Set:  [400 mL] 400 mL PEEP:  [14 cmH20-18 cmH20] 14 cmH20 Plateau Pressure:  [25 cmH20-30 cmH20] 25 cmH20   Intake/Output Summary (Last 24 hours) at 04/08/2019 [id number] Last data filed at 04/08/2019 0600 Gross per 24 hour  Intake 3157.74 ml  Output 1390 ml  Net 1767.74 ml   Filed Weights   04/06/19 0500 04/07/19 0500 04/08/19 0500  Weight: 132.2 kg 134.6 kg (!) 141.4 kg    Examination:  General:  In bed on vent HENT: NCAT ETT in place PULM: CTA B, vent supported breathing CV: RRR, no mgr GI: BS+, soft, nontender MSK: normal bulk and tone Neuro: sedated on vent  April 06, 2019 chest x-ray images independently reviewed showing endotracheal tube and left IJ central line in place, bilateral consolidation and airspace disease, cardiomegaly  Resolved Hospital Problem list     Assessment & Plan:  ARDS due to COVID 19 pneumonia: Mucus plugging left lower lobe 7/4 Baseline atrial shunt ? Pulmonary embolism given COVID, acute on chronic pulmonary hypertension Continue full mechanical ventilatory support via the ARDS protocol Target tidal volume 6 to 8 cc/kg ideal body weight Target driving pressure less than 15 cm of water Monitor plateau pressure ideally less than 30 cm of water but considering her obesity I suspect her baseline intrathoracic pressure is quite  high so could likely tolerate higher pressures Increase PEEP to 18 today Repeat ABG around 4 PM Add guaifenesin Add hypertonic saline Antivirals (remdesivir) and hydrocortisone to continue Hold actemra with possible bacterial infection and  immunocompromised state Adjust lovenox to treatment dosing for empiric treatment of pulmonary embolism  Aspiration pneumonia Continue cefepime, 5 day course  UTI (Klebsiella OSH) cefepime  Immunocompromised As above  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  Best practice:  Diet: tube feeding Pain/Anxiety/Delirium protocol (if indicated): RASS -4 to -5 per intermittent vecuronium protocol VAP protocol (if indicated): yes DVT prophylaxis: yes GI prophylaxis: famotidine Glucose control: SSI Mobility: bed rest Code Status: DNR but continue full medical support Family Communication: I had multiple lengthy conversations with the patient's family today including her sister Christine Patterson as well as her daughters Christine Patterson.  I explained that Christine Patterson is severely ill from COVID and requiring high levels of life support to stay alive.  I explained that I thought that her overall prognosis was poor but that I did not think it was unreasonable to continue to support her and hope for a recovery.  I did explain that in the event of cardiac arrest CPR, electric shocks and other interventions would be medically nonbeneficial and would cause more harm than good.  They voiced understanding and agreed to a DNR status in the event of a cardiac arrest.  We will continue full mechanical ventilatory support and other forms of medical support in the meantime. Disposition: remain in ICU  Labs   CBC: Recent Labs  Lab 02/01/19 2015  04/06/19 0310 04/06/19 0450 04/06/19 1721 04/07/19 0500 04/07/19 1622 04/08/19 0500   WBC 16.9*  --  14.7*  --   --  9.6  --  8.2  NEUTROABS 14.8*  --  12.6*  --   --  8.1*  --  6.6  HGB 12.4   < > 11.4* 16.0* 11.6* 9.5* 11.2* 10.2*  HCT 44.3   < > 42.5 47.0* 34.0* 35.9* 33.0* 38.5  MCV 94.1  --  94.7  --   --  95.7  --  94.6  PLT 191  --  192  --   --  152  --  171   < > = values in this interval not displayed.    Basic Metabolic Panel: Recent Labs  Lab 02/01/19 2015  04/06/19 0310  04/06/19 0450 04/06/19 1030 04/06/19 1636 04/06/19 1721 04/07/19 0500 04/07/19 1622 04/07/19 1719 04/08/19 0500  NA 153*   < > 154*  --  155*  --   --  151* 142 151*  --  152*  K 4.8   < > 4.5  --  4.5  --   --  4.3 3.7 4.1  --  3.9  CL 116*  --  116*  --   --   --   --   --  110  --   --  118*  CO2 26  --  30  --   --   --   --   --  24  --   --  28  GLUCOSE 117*  --  127*  --   --   --   --   --  415*  --   --  154*  BUN 20  --  20  --   --   --   --   --  21*  --   --  27*  CREATININE 1.47*  --  1.20*  --   --   --   --   --  0.78  --   --  0.76  CALCIUM 7.6*  --  7.6*  --   --   --   --   --  6.5*  --   --  7.4*  MG 1.7  --   --    < >  --  2.2 2.1  --  1.8  --  2.3 2.5*  PHOS  --   --   --   --   --  2.2* 1.9*  --  1.1*  --  1.0*  --    < > = values in this interval not displayed.   GFR: Estimated Creatinine Clearance: 115 mL/min (by C-G formula based on SCr of 0.76 mg/dL). Recent Labs  Lab 04/17/19 2015 04/17/19 2250 04/06/19 0310 04/06/19 0315 04/07/19 0500 04/08/19 0500  PROCALCITON  --   --   --  3.20 1.23 0.68  WBC 16.9*  --  14.7*  --  9.6 8.2  LATICACIDVEN 1.4 1.2  --   --   --   --     Liver Function Tests: Recent Labs  Lab 17-Apr-2019 2015 04/06/19 0310 04/07/19 0500 04/08/19 0500  AST 92* 100* 66* 51*  ALT 34 35 28 29  ALKPHOS 65 58 50 58  BILITOT 0.6 0.4 0.1* 0.7  PROT 7.0 6.8 5.4* 6.4*  ALBUMIN 2.5* 2.4* 1.8* 2.2*   No results for input(s): LIPASE, AMYLASE in the last 168 hours. No results for input(s): AMMONIA in the last 168 hours.  ABG     Component Value Date/Time   PHART 7.342 (L) 04/07/2019 1622   PCO2ART 48.9 (H) 04/07/2019 1622   PO2ART 63.0 (L) 04/07/2019 1622   HCO3 26.7 04/07/2019 1622   TCO2 28 04/07/2019 1622   O2SAT 91.0 04/07/2019 1622     Coagulation Profile: Recent Labs  Lab April 17, 2019 2015  INR 1.2    Cardiac Enzymes: No results for input(s): CKTOTAL, CKMB, CKMBINDEX, TROPONINI in the last 168 hours.  HbA1C: No results found for: HGBA1C  CBG: Recent Labs  Lab 04/07/19 1216 04/07/19 1623 04/07/19 1929 04/07/19 2336 04/08/19 0439  GLUCAP 117* 136* 130* 124* 142*     Critical care time: 95 minutes     Roselie Awkward, MD [place] [facility] Pager: [phone] Cell: [phone] If no response, call [phone]

## 2019-04-08 NOTE — Procedures (Signed)
PCCM Video Bronchoscopy Procedure Note  The patient was informed of the risks (including but not limited to bleeding, infection, respiratory failure, lung injury, tooth/oral injury) and benefits of the procedure and gave consent, see chart.  Indication: Acute severe hypoxemia  Post Procedure Diagnosis: ARDS from COVID 19, mucus plugging left lower lobe  Location: [facility] ICU  Condition pre procedure: Critically ill, on ventilator  Medications for procedure: fentanyl infusion, versed infusion  Procedure description: The bronchoscope was introduced through the endotracheal tube and passed to the bilateral lungs to the level of the subsegmental bronchi throughout the tracheobronchial tree.  Airway exam revealed normal trachea, scant mucus at the carina, normal appearing tracheobronchial tree in the right lung (no masses, no inflammatory change).   The left mainstem bronchus and left lower lobe showed thick dark mucus which was suctioned easily with immediate improvement in the patients hypoxemia.   Procedures performed: therapeutic aspiration of mucus from the left lower lobe bronchus  Specimens sent: none  Condition post procedure: critically ill, on vent  EBL: none from procedure  Complications: none immediate  Roselie Awkward, MD [place] [facility] Pager: [phone] Cell: [phone] If no response, call [phone]

## 2019-04-08 NOTE — Progress Notes (Addendum)
25 ml of versed wasted in sink along with 15 ml of fentanyl with [place]

## 2019-04-08 NOTE — Progress Notes (Signed)
Sats dropping in 40's post un-prone despite suctioning, new 02 probe, and 100% Fi02. MD at bedside. Orders for Vec 10 mg.  RN will continue to monitor.

## 2019-04-08 NOTE — Progress Notes (Addendum)
eLink Physician-Brief Progress Note Patient Name: Christine Patterson DOB: July 15, 1971 MRN: [id number]   Date of Service  04/08/2019  HPI/Events of Note  A 48 year old female with a history of sarcoid, pulmonary hypertension was admitted to the ICU with hypotension.  Received a phone call from Dr. Loleta Books regarding the hypotension and bradycardia  eICU Interventions  Recommended to start on dopamine and evaluate for bradycardia with EKG, troponin, electrolytes and TSH.  If the patient continues to be hypotensive with bradycardia consider adding dobutamine.  Discussed with Dr. Loleta Books.      Intervention Category Major Interventions: Arrhythmia - evaluation and management;Hypotension - evaluation and management Intermediate Interventions: Communication with other healthcare providers and/or family  Mady Gemma 04/08/2019, 10:03 PM   6:51 AM Troponin 217.  Ordered to trend troponin. She made 3 L of urine and potassium was 3.5. Ordered 40 mEq of oral potassium replacement Signed out to Dr. Chase Caller.

## 2019-04-08 NOTE — Progress Notes (Signed)
ANTICOAGULATION CONSULT NOTE - Initial Consult  Pharmacy Consult for Lovenox Indication: suspected VTE  No Known Allergies  Patient Measurements: Height: 5' 2" (157.5 cm) Weight: (!) 311 lb 12.8 oz (141.4 kg) IBW/kg (Calculated) : 50.1  Vital Signs: Temp: 97.5 F (36.4 C) (07/04 0800) Temp Source: Axillary (07/04 0800) BP: 89/53 (07/04 1200) Pulse Rate: 35 (07/04 1200)  Labs: Recent Labs    04/20/2019 2015  04/06/19 0310  04/06/19 1636  04/06/19 1845 04/07/19 0500 04/07/19 1204 04/07/19 1622 04/08/19 0500  HGB 12.4   < > 11.4*   < >  --    < >  --  9.5*  --  11.2* 10.2*  HCT 44.3   < > 42.5   < >  --    < >  --  35.9*  --  33.0* 38.5  PLT 191  --  192  --   --   --   --  152  --   --  171  APTT 36  --   --   --   --   --   --   --   --   --   --   LABPROT 14.9  --   --   --   --   --   --   --   --   --   --   INR 1.2  --   --   --   --   --   --   --   --   --   --   CREATININE 1.47*  --  1.20*  --   --   --   --  0.78  --   --  0.76  TROPONINIHS  --   --   --   --  30.0*  --  26.0*  --  19.0*  --   --    < > = values in this interval not displayed.    Estimated Creatinine Clearance: 115 mL/min (by C-G formula based on SCr of 0.76 mg/dL).   Medical History: Past Medical History:  Diagnosis Date  . CHF (congestive heart failure) ([place])   . Diabetes insipidus ([place])   . GERD (gastroesophageal reflux disease)   . Glaucoma    L eye blind  . HTN (hypertension)   . Hypothyroidism   . Obesity hypoventilation syndrome (H[facility])    on chronic home O2 2L [place]  . OSA (obstructive sleep apnea)   . Osteoporosis   . Panhypopituitarism ([place])   . Pulmonary hypertension ([place])   . Sarcoidosis    w/chronic steroid use    Medications:  Scheduled:  . EPINEPHrine      . sodium bicarbonate      . artificial tears  1 application Both Eyes B4W  . aspirin  81 mg Per Tube Daily  . atorvastatin  40 mg Per Tube q1800  . chlorhexidine gluconate (MEDLINE KIT)  15 mL Mouth Rinse BID  .  Chlorhexidine Gluconate Cloth  6 each Topical Daily  . desmopressin  0.2 mg Per Tube Daily  . enoxaparin (LOVENOX) injection  70 mg Subcutaneous Q12H  . famotidine  20 mg Per Tube BID  . feeding supplement (PRO-STAT SUGAR FREE 64)  30 mL Per Tube Daily  . free water  250 mL Per Tube Q6H  . hydrocortisone sod succinate (SOLU-CORTEF) inj  50 mg Intravenous Q8H  . insulin aspart  0-9 Units Subcutaneous Q4H  . levothyroxine  125 mcg  Per Tube Q0600  . mouth rinse  15 mL Mouth Rinse 10 times per day  . mupirocin ointment  1 application Nasal BID  . sodium chloride flush  10-40 mL Intracatheter Q12H  . sodium chloride flush  10-40 mL Intracatheter Q12H  . vitamin C  500 mg Per Tube Daily  . zinc sulfate  220 mg Per Tube Daily   Infusions:  . sodium chloride Stopped (04/08/19 1151)  . ceFEPime (MAXIPIME) IV Stopped (04/08/19 0632)  . feeding supplement (VITAL HIGH PROTEIN) 20 mL/hr at 04/08/19 0230  . fentaNYL infusion INTRAVENOUS 200 mcg/hr (04/08/19 1200)  . midazolam 4 mg/hr (04/08/19 1200)  . norepinephrine (LEVOPHED) Adult infusion Stopped (04/07/19 1916)  . remdesivir 100 mg in NS 250 mL Stopped (04/07/19 2238)  . vancomycin 250 mL/hr at 04/08/19 1200    Assessment: 48 yoF admitted on 7/1 with severe hypoxemia, +COVID with PMH of neurosarcoidosis, panhypopituitarism, suspected pulmonary sarcoidosis, OSA.  Pharmacy is consulted to increase from prophylatic to treatment dose Lovenox today for suspected VTE event d/t hypoxia and acute worsening.    Today, 04/08/2019  CBC: Hgb low/stable at 10.2, Plt WNL SCr <1 with CrCl > 100 ml/min.   Goal of Therapy:  Anti-Xa level 0.6-1 units/ml 4hrs after LMWH dose given Monitor platelets by anticoagulation protocol: Yes   Plan:  Lovenox increased to 1 mg/kg (140 mg) SQ BID.  Christine Shade PharmD, BCPS Clinical pharmacist phone 7am- 5pm: [phone] 04/08/2019 1:49 PM   

## 2019-04-09 LAB — POCT I-STAT 7, (LYTES, BLD GAS, ICA,H+H)
Acid-Base Excess: 4 mmol/L — ABNORMAL HIGH (ref 0.0–2.0)
Bicarbonate: 30.1 mmol/L — ABNORMAL HIGH (ref 20.0–28.0)
Calcium, Ion: 1.21 mmol/L (ref 1.15–1.40)
HCT: 32 % — ABNORMAL LOW (ref 36.0–46.0)
Hemoglobin: 10.9 g/dL — ABNORMAL LOW (ref 12.0–15.0)
O2 Saturation: 96 %
Patient temperature: 96.1
Potassium: 3.1 mmol/L — ABNORMAL LOW (ref 3.5–5.1)
Sodium: 168 mmol/L (ref 135–145)
TCO2: 32 mmol/L (ref 22–32)
pCO2 arterial: 48.3 mmHg — ABNORMAL HIGH (ref 32.0–48.0)
pH, Arterial: 7.397 (ref 7.350–7.450)
pO2, Arterial: 82 mmHg — ABNORMAL LOW (ref 83.0–108.0)

## 2019-04-09 LAB — CBC WITH DIFFERENTIAL/PLATELET
Abs Immature Granulocytes: 0.45 10*3/uL — ABNORMAL HIGH (ref 0.00–0.07)
Basophils Absolute: 0.1 10*3/uL (ref 0.0–0.1)
Basophils Relative: 1 %
Eosinophils Absolute: 0.1 10*3/uL (ref 0.0–0.5)
Eosinophils Relative: 2 %
HCT: 37.4 % (ref 36.0–46.0)
Hemoglobin: 10.3 g/dL — ABNORMAL LOW (ref 12.0–15.0)
Immature Granulocytes: 6 %
Lymphocytes Relative: 14 %
Lymphs Abs: 1 10*3/uL (ref 0.7–4.0)
MCH: 25.8 pg — ABNORMAL LOW (ref 26.0–34.0)
MCHC: 27.5 g/dL — ABNORMAL LOW (ref 30.0–36.0)
MCV: 93.7 fL (ref 80.0–100.0)
Monocytes Absolute: 0.5 10*3/uL (ref 0.1–1.0)
Monocytes Relative: 7 %
Neutro Abs: 5.2 10*3/uL (ref 1.7–7.7)
Neutrophils Relative %: 70 %
Platelets: 165 10*3/uL (ref 150–400)
RBC: 3.99 MIL/uL (ref 3.87–5.11)
RDW: 18.1 % — ABNORMAL HIGH (ref 11.5–15.5)
WBC: 7.4 10*3/uL (ref 4.0–10.5)
nRBC: 2.4 % — ABNORMAL HIGH (ref 0.0–0.2)

## 2019-04-09 LAB — COMPREHENSIVE METABOLIC PANEL
ALT: 31 U/L (ref 0–44)
AST: 51 U/L — ABNORMAL HIGH (ref 15–41)
Albumin: 2.2 g/dL — ABNORMAL LOW (ref 3.5–5.0)
Alkaline Phosphatase: 64 U/L (ref 38–126)
Anion gap: 7 (ref 5–15)
BUN: 29 mg/dL — ABNORMAL HIGH (ref 6–20)
CO2: 26 mmol/L (ref 22–32)
Calcium: 7.7 mg/dL — ABNORMAL LOW (ref 8.9–10.3)
Chloride: 128 mmol/L — ABNORMAL HIGH (ref 98–111)
Creatinine, Ser: 0.84 mg/dL (ref 0.44–1.00)
GFR calc Af Amer: >60 mL/min (ref >60)
GFR calc non Af Amer: >60 mL/min (ref >60)
Glucose, Bld: 115 mg/dL — ABNORMAL HIGH (ref 70–99)
Potassium: 3.4 mmol/L — ABNORMAL LOW (ref 3.5–5.1)
Sodium: 161 mmol/L (ref 135–145)
Total Bilirubin: 0.3 mg/dL (ref 0.3–1.2)
Total Protein: 6.3 g/dL — ABNORMAL LOW (ref 6.5–8.1)

## 2019-04-09 LAB — D-DIMER, QUANTITATIVE: D-Dimer, Quant: 2.92 ug/mL-FEU — ABNORMAL HIGH (ref 0.00–0.50)

## 2019-04-09 LAB — CULTURE, RESPIRATORY W GRAM STAIN

## 2019-04-09 LAB — GLUCOSE, CAPILLARY
Glucose-Capillary: 126 mg/dL — ABNORMAL HIGH (ref 70–99)
Glucose-Capillary: 92 mg/dL (ref 70–99)
Glucose-Capillary: 89 mg/dL (ref 70–99)
Glucose-Capillary: 95 mg/dL (ref 70–99)
Glucose-Capillary: 85 mg/dL (ref 70–99)
Glucose-Capillary: 84 mg/dL (ref 70–99)

## 2019-04-09 LAB — TROPONIN I (HIGH SENSITIVITY)
Troponin I (High Sensitivity): 217 ng/L (ref <18)
Troponin I (High Sensitivity): 316 ng/L (ref <18)

## 2019-04-09 LAB — PHOSPHORUS: Phosphorus: 1.2 mg/dL — ABNORMAL LOW (ref 2.5–4.6)

## 2019-04-09 LAB — PROCALCITONIN: Procalcitonin: 0.5 ng/mL

## 2019-04-09 LAB — BASIC METABOLIC PANEL
Anion gap: 5 (ref 5–15)
BUN: 31 mg/dL — ABNORMAL HIGH (ref 6–20)
CO2: 26 mmol/L (ref 22–32)
Calcium: 7.3 mg/dL — ABNORMAL LOW (ref 8.9–10.3)
Chloride: 124 mmol/L — ABNORMAL HIGH (ref 98–111)
Creatinine, Ser: 0.86 mg/dL (ref 0.44–1.00)
GFR calc Af Amer: >60 mL/min (ref >60)
GFR calc non Af Amer: >60 mL/min (ref >60)
Glucose, Bld: 120 mg/dL — ABNORMAL HIGH (ref 70–99)
Potassium: 3.5 mmol/L (ref 3.5–5.1)
Sodium: 155 mmol/L — ABNORMAL HIGH (ref 135–145)

## 2019-04-09 LAB — TSH: TSH: <0.01 u[IU]/mL — ABNORMAL LOW (ref 0.350–4.500)

## 2019-04-09 LAB — T4, FREE: Free T4: 0.97 ng/dL (ref 0.61–1.12)

## 2019-04-09 LAB — FERRITIN: Ferritin: 195 ng/mL (ref 11–307)

## 2019-04-09 LAB — MAGNESIUM
Magnesium: 2.3 mg/dL (ref 1.7–2.4)
Magnesium: 2.4 mg/dL (ref 1.7–2.4)

## 2019-04-09 LAB — C-REACTIVE PROTEIN: CRP: 6.1 mg/dL — ABNORMAL HIGH (ref <1.0)

## 2019-04-09 MED ORDER — FREE WATER
300.0000 mL | Freq: Four times a day (QID) | Status: DC
  Administered 2019-04-09 (×3): 300 mL

## 2019-04-09 MED ORDER — SODIUM CHLORIDE 0.45 % IV SOLN
INTRAVENOUS | Status: DC
  Administered 2019-04-09 (×2): via INTRAVENOUS

## 2019-04-09 MED ORDER — POTASSIUM CHLORIDE 20 MEQ/15ML (10%) PO SOLN
40.0000 meq | Freq: Once | ORAL | Status: AC
  Administered 2019-04-09: 40 meq
  Filled 2019-04-09: qty 30

## 2019-04-09 MED ORDER — LACTATED RINGERS IV BOLUS
1000.0000 mL | Freq: Once | INTRAVENOUS | Status: AC
  Administered 2019-04-09: 14:00:00 1000 mL via INTRAVENOUS

## 2019-04-09 MED ORDER — DESMOPRESSIN ACETATE 4 MCG/ML IJ SOLN
2.0000 ug | Freq: Two times a day (BID) | INTRAMUSCULAR | Status: DC
  Administered 2019-04-09 (×2): 2 ug via SUBCUTANEOUS
  Filled 2019-04-09 (×3): qty 1

## 2019-04-09 MED ORDER — POTASSIUM PHOSPHATES 15 MMOLE/5ML IV SOLN
30.0000 mmol | Freq: Once | INTRAVENOUS | Status: AC
  Administered 2019-04-09: 14:00:00 30 mmol via INTRAVENOUS
  Filled 2019-04-09: qty 10

## 2019-04-09 NOTE — Progress Notes (Signed)
Pt's head turned at this time to the right side.  Pt had copious secretions removed from nares and mouth after turn. (Tan/Pink tinged).  No compilations otherwise. RT will continue to monitor.

## 2019-04-09 NOTE — Progress Notes (Signed)
CRITICAL VALUE ALERT  Critical Value: Troponin 316, sodium 168  Date & Time Notied: 04/09/19 @ 7619  Provider Notified: Dr Sloan Leiter  Orders Received/Actions taken: MD entered orders for 1/2 NS & sub cut DDAVP

## 2019-04-09 NOTE — Progress Notes (Signed)
NAME:  Christine GemmaJennifer Bellizzi, MRN:  [id number], DOB:  December 05, 1970, LOS: 4 ADMISSION DATE:  04/22/2019, CONSULTATION DATE:  7/2 REFERRING MD:  Dr. Thedore MinsSingh, CHIEF COMPLAINT:  dyspnea  Brief History   48 y/o female with a complex medical history admitted for ARDS from COVID pneumonia.  There was also concern for aspiration pneumonia on admission.    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 Prone overnight July 4 > mucus plug after moving to supine position  Severe acute hypoxemia again later  Emergent bronchoscopy    Consults:  PCCM  Procedures:  7/1 ETT >  7/2 L IJ CVL >  7/4 Emergent bronchoscopy: mucus plugging left lower lobe, emergent bedside echo:  normal LV contractility, no pericardial fluid, dilated, hypocontractile RV  Significant Diagnostic Tests:  12/2018 HRCT showed findings worrisome for pulmonary parenchymal disease from sarcoidosis, likely pulmonary hypertension 11/2017 TTE> moderate LVH, LVEF normal, RV enlarted, hypocontractile free wall, mild global decrease, RVSP 38 mmHg; atrial shunting noted  Micro Data:  6/30 SARS-COV-2> positive 6/30 blood culture OSH > 1/4 coag neg staph 7/1 urine culture > klebsiella 7/2 resp culture   Antimicrobials:  7/1  cefepime>  7/1 flagyl > 7/4 7/1 remdesivir >  7/1vancomycin >  7/1 hydrocortisone >   Interim history/subjective:    7/5  - remains unresponsive. Was bradycardic and switched levophed to dopamine. MAP now 68 with sbp 98.  On fent gtt125 and versed 2mg . Significant DI with high urinary volume and hypernatremia. Pressor need felt due to be volume loss -> DDAVP changed from oral to subcut -> Urine Op improved. On 70% fio2, 18 peep -> Po2 82.  Currently remains on vanc and cefepime; both day 5  K and phos severely low - ordered opharm to repete  On Rx dose lovenox  Objective   Blood pressure (!) 97/48, pulse (!) 59, temperature 100.2 F (37.9 C), resp. rate (!) 35, height 5\' 2"  (1.575 m), weight (!) 141.4 kg, SpO2 100 %.    Vent Mode: PRVC FiO2 (%):  [70 %-100 %] 70 % Set Rate:  [35 bmp] 35 bmp Vt Set:  [400 mL] 400 mL PEEP:  [18 cmH20] 18 cmH20 Plateau Pressure:  [28 cmH20-31 cmH20] 30 cmH20   Intake/Output Summary (Last 24 hours) at 04/09/2019 1217 Last data filed at 04/09/2019 1100 Gross per 24 hour  Intake 2931.71 ml  Output 6815 ml  Net -3883.29 ml   Filed Weights   04/06/19 0500 04/07/19 0500 04/08/19 0500  Weight: 132.2 kg 134.6 kg (!) 141.4 kg    Examination: General Appearance:  Looks criticall ill OBESE - + , SUPINE + Head:  Normocephalic, without obvious abnormality, atraumatic Eyes:  PERRL - yes, conjunctiva/corneas - muddy     Ears:  Normal external ear canals, both ears Nose:  G tube - no Throat:  ETT TUBE - yds , OG tube - yes Neck:  Supple,  No enlargement/tenderness/nodules Lungs:  Clear to auscultation bilaterally, Ventilator   Synchrony - yes Heart:  S1 and S2 normal, no murmur, CVP - no.  Pressors - yes Abdomen:  Soft, no masses, no organomegaly Genitalia / Rectal:  Not done Extremities:  Extremities- intact Skin:  ntact in exposed areas . Sacral area - not examined Neurologic:  Sedation - fent gtt, versed gtt -> RASS - -4      LABS     PULMONARY Recent Labs  Lab 04/06/19 1721 04/07/19 1622 04/08/19 1321 04/08/19 1528 04/09/19 0812  PHART 7.257* 7.342* 7.131* 7.260* 7.397  PCO2ART 64.8* 48.9* 69.4* 57.1* 48.3*  PO2ART 90.0 63.0* 32.0* 71.0* 82.0*  HCO3 28.9* 26.7 23.4 25.6 30.1*  TCO2 31 28 26 27  32  O2SAT 95.0 91.0 45.0 91.0 96.0    CBC Recent Labs  Lab 04/07/19 0500  04/08/19 0500  04/08/19 1528 04/09/19 0419 04/09/19 0812  HGB 9.5*   < > 10.2*   < > 11.2* 10.3* 10.9*  HCT 35.9*   < > 38.5   < > 33.0* 37.4 32.0*  WBC 9.6  --  8.2  --   --  7.4  --   PLT 152  --  171  --   --  165  --    < > = values in this interval not displayed.    COAGULATION Recent Labs  Lab Apr 08, 2019 2015  INR 1.2    CARDIAC  No results for input(s): TROPONINI in the last 168 hours. No results for input(s): PROBNP in the last 168 hours.   CHEMISTRY Recent Labs  Lab 04/06/19 0310  04/06/19 1636  04/07/19 0500  04/07/19 1719 04/08/19 0500 04/08/19 1321 04/08/19 1528 04/08/19 2222 04/09/19 0419 04/09/19 0812  NA 154*   < >  --    < > 142   < >  --  152* 155* 156* 155* 161* 168*  K 4.5   < >  --    < > 3.7   < >  --  3.9 3.8 3.6 3.5 3.4* 3.1*  CL 116*  --   --   --  110  --   --  118*  --   --  124* 128*  --   CO2 30  --   --   --  24  --   --  28  --   --  26 26  --   GLUCOSE 127*  --   --   --  415*  --   --  154*  --   --  120* 115*  --   BUN 20  --   --   --  21*  --   --  27*  --   --  31* 29*  --   CREATININE 1.20*  --   --   --  0.78  --   --  0.76  --   --  0.86 0.84  --   CALCIUM 7.6*  --   --   --  6.5*  --   --  7.4*  --   --  7.3* 7.7*  --   MG  --    < > 2.1  --  1.8  --  2.3 2.5*  --   --  2.3 2.4  --   PHOS  --    < > 1.9*  --  1.1*  --  1.0* 1.7*  --   --   --  1.2*  --    < > =  values in this interval not displayed.   Estimated Creatinine Clearance: 109.5 mL/min (by C-G formula based on SCr of 0.84 mg/dL).   LIVER Recent Labs  Lab Nov 19, 2018 2015 04/06/19 0310 04/07/19 0500 04/08/19 0500  04/09/19 0419  AST 92* 100* 66* 51* 51*  ALT 34 35 28 29 31   ALKPHOS 65 58 50 58 64  BILITOT 0.6 0.4 0.1* 0.7 0.3  PROT 7.0 6.8 5.4* 6.4* 6.3*  ALBUMIN 2.5* 2.4* 1.8* 2.2* 2.2*  INR 1.2  --   --   --   --      INFECTIOUS Recent Labs  Lab Nov 19, 2018 2015 Nov 19, 2018 2250  04/07/19 0500 04/08/19 0500 04/09/19 0419  LATICACIDVEN 1.4 1.2  --   --   --   --   PROCALCITON  --   --    < > 1.23 0.68 0.50   < > = values in this interval not displayed.     ENDOCRINE CBG (last 3)  Recent Labs    04/09/19 0409 04/09/19 0751 04/09/19 1240  GLUCAP 126* 92 89         IMAGING x48h  - image(s) personally visualized  -   highlighted in bold Dg Chest Port 1 View  Result Date: 04/08/2019 CLINICAL DATA:  Hypoxia. EXAM: PORTABLE CHEST 1 VIEW COMPARISON:  Radiograph same day. FINDINGS: Stable cardiomediastinal silhouette. Endotracheal and nasogastric tubes are unchanged in position. Left internal jugular catheter is unchanged in position. No pneumothorax is noted. Right-sided PICC line is noted with distal tip in expected position of cavoatrial junction. Stable diffuse lung opacities are noted most consistent with pneumonia or edema. Small bilateral pleural effusions may be present. Bony thorax is unremarkable. IMPRESSION: Interval placement of right-sided PICC line with distal tip in expected position of cavoatrial junction. Otherwise stable support apparatus. Stable bilateral lung opacities are noted concerning for pneumonia or edema with possible small pleural effusions. Electronically Signed   By: Lupita RaiderJames  Green Jr M.D.   On: 04/08/2019 15:22   Dg Chest Port 1 View  Result Date: 04/08/2019 CLINICAL DATA:  Worsening oxygenation. EXAM: PORTABLE CHEST 1 VIEW COMPARISON:  Chest radiograph 04/06/2019 FINDINGS: ET tube mid trachea. Left IJ central venous catheter tip projects over the right atrium. Enteric tube side port terminates in the mid esophagus. Stable cardiomegaly. Monitoring leads overlie the  patient. Similar-appearing diffuse bilateral airspace opacities. No large pleural effusion or pneumothorax. Thoracic spine degenerative changes. IMPRESSION: ET tube side-port projects in the midesophagus, recommend advancement. Otherwise similar-appearing bilateral diffuse airspace opacities. Electronically Signed   By: Annia Beltrew  Davis M.D.   On: 04/08/2019 11:41   [place] Ekg Site Rite  Result Date: 04/07/2019 If Site Rite image not attached, placement could not be confirmed due to current cardiac rhythm.    Resolved Hospital Problem list     Assessment & Plan:  ARDS due to COVID 19 pneumonia: Mucus plugging left lower lobe 7/4 Baseline atrial shunt  Baseline pulmonary hypertension and atrial shunting, now in acute decompensated cor-pulmonal from severe acute respiratory failure ? Pulmonary embolism given COVID, acute on chronic pulmonary hypertension    04/09/2019 - > does not meet criteria for SBT/Extubation in setting of Acute Respiratory Failure due to high peep/fio2 and pressor need  PLAN - ARDS protocol - re-prone 04/09/2019 - high peep needed due to covid and ARDS - continue guiafenesin - continue hypertonic saline - Antivirals (remdesivir) and hydrocortisone to continue - No Tocilizumab due to bacterial infection and immunocompromised state - Rx dose lovenox  for  empiric treatment of pulmonary embolism  Circulatory shock with bradycardia - fluid bolus  -dopamine - hydrocort due to pan hypopiot  Electrolyte imbalance  - replete phos and K  Aspiration pneumonia Continue cefepime, 5 day course  UTI (Klebsiella OSH) cefepime  Immunocompromised As above  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  Best practice:  Diet: tube feeding Pain/Anxiety/Delirium  protocol (if indicated): RASS -4 to -5 per intermittent vecuronium protocol VAP protocol (if indicated): yes DVT prophylaxis: yes GI prophylaxis: famotidine Glucose control: SSI Mobility: bed rest Code Status: DNR but continue full medical support Family Communication:   7/4 - I had multiple lengthy conversations with the patient's family today including her sister [name] as well as her daughters [name].  I explained that [name] is severely ill from COVID and requiring high levels of life support to stay alive.  I explained that I thought that her overall prognosis was poor but that I did not think it was unreasonable to continue to support her and hope for a recovery.  I did explain that in the event of cardiac arrest CPR, electric shocks and other interventions would be medically nonbeneficial and would cause more harm than good.  They voiced understanding and agreed to a DNR status in the event of a cardiac arrest.  We will continue full mechanical ventilatory support and other forms of medical support in the meantime. Disposition: remain in ICU   7/5 - called sister belinda listed number of [phone] x 2 - says ":call cannot be completed"      ATTESTATION & SIGNATURE   The patient Christine Patterson is critically ill with multiple organ systems failure and requires high complexity decision making for assessment and support, frequent evaluation and titration of therapies, application of advanced monitoring technologies and extensive interpretation of multiple databases.   Critical Care Time devoted to patient care services described in this note is  30  Minutes. This time reflects time of care of this signee Dr Kalman ShanMurali Cheyenne Schumm. This critical care time does not reflect procedure time, or teaching time or supervisory time of PA/NP/Med student/Med Resident etc but could involve care discussion time     Dr. Kalman ShanMurali Mikaelyn Arthurs, M.D., [facility].C.C.P Pulmonary and Critical Care Medicine Staff  Physician [facility] Pager: [phone], If no answer or between  15:00h - 7:00h: call [phone]  04/09/2019 2:21 PM

## 2019-04-09 NOTE — Progress Notes (Signed)
Elink RN notified of urine output of 1481mLs from 2230 to 0000. Awaiting results of labs sent earlier in shift.

## 2019-04-09 NOTE — Progress Notes (Signed)
Lab results called to and reviewed with Elink RN, including critical results of troponin 217

## 2019-04-09 NOTE — Progress Notes (Signed)
Daughter updated by phone. All questions answered, she voiced appreciation of care

## 2019-04-09 NOTE — Progress Notes (Signed)
Dopamine increased from 0.5 to 1.5 d/t MAP of 59. Shortly after, SBP was 150s and HR was SVT to the 120s. Infusion decreased back to 0.25mcg. Elink MD notified.

## 2019-04-09 NOTE — Progress Notes (Signed)
Pharmacy Antibiotic Note  Christine Patterson is a 48 y.o. female admitted on 04/15/2019 after being found unresponsive with sepsis, COVID-19. CTA chest shows bilateral atypical PNA so started broad spectrum abx. Pt required intubation. Pharmacy has been consulted for Vancomycin dosing.  Plan:  Continue Vancomycin 2000 mg IV Q 24 hrs.    Goal AUC 400-550.  Expected AUC: 498 using SCr 0.8.  Vd coeff: 0.5  Will f/u renal function, micro data, and pt's clinical condition  Vanc levels on 7/6.  Height: 5\' 2"  (157.5 cm) Weight: (!) 311 lb 12.8 oz (141.4 kg) IBW/kg (Calculated) : 50.1  Temp (24hrs), Avg:96.2 F (35.7 C), Min:95 F (35 C), Max:98.4 F (36.9 C)  Recent Labs  Lab 04/09/2019 2015 04/27/2019 2250 04/06/19 0310 04/07/19 0500 04/08/19 0500 04/08/19 2222 04/09/19 0419  WBC 16.9*  --  14.7* 9.6 8.2  --  7.4  CREATININE 1.47*  --  1.20* 0.78 0.76 0.86 0.84  LATICACIDVEN 1.4 1.2  --   --   --   --   --     Estimated Creatinine Clearance: 109.5 mL/min (by C-G formula based on SCr of 0.84 mg/dL).    No Known Allergies  Antimicrobials this admission: 7/1 Remdesivir >> 7/5 7/1 Vanc >> 7/1 Cefepime >> 7/1 Flagyl >>  Dose adjustments this admission: 7/3 empirically adjusted vanc for renal function  Microbiology results:  [place], updated on 7/3: 6/30 BCx: 1/4 bottles CNS 6/30 UCx: >100k Klebsiella pneumoniae (resist amp, bactrim.  Sens: cefazolin, gent, tob, LVQ, Imipenem)  [place]: 7/2 MRSA PCR: + 7/2 TA: rare S.aureus (sens pending)  Thank you for allowing pharmacy to be a part of this patient's care.  Gretta Arab PharmD, BCPS Clinical pharmacist phone 7am- 5pm: [phone] 04/09/2019 10:56 AM

## 2019-04-09 NOTE — Progress Notes (Addendum)
PROGRESS NOTE                                                                                                                                                                                                             Patient Demographics:    Tracy Gerken, is a 48 y.o. female, DOB - [birth year], YKD:[id number]  Outpatient Primary MD for the patient is System, Pcp Not In    LOS - 4  No chief complaint on file.      Brief Narrative: Patient is a 48 y.o. 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   Subjective:    Anu Stagner today remains intubated and sedated.   Assessment  & Plan :   Acute Hypoxic Resp Failure with ARDS due to Covid 19 Viral pneumonia and suspected superimposed aspiration pneumonitis: Continue full ventilator support per PCCM.  On steroids and Remdesivir.  Sputum cultures positive for MRSA-continue vancomycin but will go ahead and discontinue cefepime.  Due to the fact that patient is already on Biologics (infliximab) as an outpatient and due to concern for aspiration/bacterial pneumonia-not given Actemra.  Inflammatory markers continue to downtrend.  Patient underwent emergent bedside bronchoscopy on 7/4 after desaturation-was found to have mucous plugging.  She has multiple comorbidities at baseline including pulmonary and neurosarcoidosis-hence given severity of her COVID-19 pneumonia with ARDS-her prognosis is probably very poor.   COVID-19  Labs:  Recent Labs    04/07/19 0500 04/07/19 0954 04/08/19 0500 04/09/19 0419  DDIMER  --  2.23* 2.43* 2.92*  FERRITIN 245  --  202 195  CRP 18.1*  --  10.6* 6.1*    No results found for: SARSCOV2NAA   COVID-19 Medications: 7/1>> Solu-Medrol 7/1>> Remdesivir   Vent Settings: Vent Mode: PRVC FiO2 (%):  [70 %-100 %] 70 % Set Rate:  [35 bmp] 35 bmp Vt Set:  [400 mL] 400 mL PEEP:  [18 cmH20] 18 cmH20 Plateau Pressure:  [28 cmH20-31 cmH20] 30 cmH20  ABG:    Component Value Date/Time  PHART 7.397 04/09/2019 0812   PCO2ART 48.3 (H) 04/09/2019 0812   PO2ART 82.0 (L) 04/09/2019 0812   HCO3 30.1 (H) 04/09/2019 0812   TCO2 32 04/09/2019 0812   ACIDBASEDEF 2.0 04/08/2019 1528   O2SAT 96.0 04/09/2019 0812    Septic shock secondary to presumed aspiration pneumonia and COVID-19: Sepsis pathophysiology is resolving-hypotensive overnight but I think that is secondary to hypovolemia secondary significant urine losses from DI.  Remains on low-dose dopamine.  Sputum cultures positive for MRSA-continue vancomycin but go ahead and stop cefepime.  Remains on Remdesivir and steroids for COVID-19.    Hypotension: Developed overnight-probably secondary to hypovolemia due to significant amount of urine output-likely from DI.  Acute metabolic and toxic encephalopathy: Unable to assess mental status as patient is on sedation.  She has neurosarcoidosis at baseline-once her sedation needs have decreased significantly-we can then assess for any neurological improvement.  Given the fact that she had profound hypoxemia and agonal respiration when EMS arrived-she is obviously at risk of some anoxic injury.  CT head on 7/2 without any acute abnormalities.  EEG ordered but currently pending  Transaminitis: Secondary to COVID-19-mild and downtrending.  Follow periodically.  Hypernatremia: Significant amount of urine output overnight-although restarted on DDAVP via tube-sodium levels continue to trend up.   Spoke with pharmacy-we will switch DDAVP to subcutaneous route and change to twice daily dosing.  Have increased free water tube-and started half-normal saline as well.  Have asked nursing staff to notify us if patient's urine output increases significantly as patient may require more IV fluids.  Panhypopituitarism: Continue steroids-but suspect we can now taper slowly, continue levothyroxine (free T4)-adding DDAVP.  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  DM-2: CBG stable with SSI  OSA on CPAP at home  Condition - Extremely Guarded-as noted above-prognosis is poor at this point.  Family Communication  :  PCCM will update family  Code Status : DNR  Diet :  Diet Order            Diet NPO time specified  Diet effective now               Disposition Plan  :  Remain inpatient  Consults  :  PCCM  Procedures  :   Bronchoscopy 7/4 ETT>> 7/1 L IJ CVL 7/1  GI prophylaxis: H2 Blocker  DVT Prophylaxis  :  Lovenox therapeutic dosing  Lab Results  Component Value Date   PLT 165 04/09/2019    Inpatient Medications  Scheduled Meds: . aspirin  81 mg Per Tube Daily  . atorvastatin  40 mg Per Tube q1800  . chlorhexidine gluconate (MEDLINE KIT)  15 mL Mouth Rinse BID  . Chlorhexidine Gluconate Cloth  6 each Topical Daily  . desmopressin  2 mcg Subcutaneous BID  . enoxaparin (LOVENOX) injection  140 mg Subcutaneous Q12H  . famotidine  20 mg Per Tube BID  . feeding supplement (PRO-STAT SUGAR FREE 64)  30 mL Per Tube Daily  . free water  300 mL Per Tube Q6H  . guaiFENesin  15 mL Oral Q6H  . hydrocortisone sod succinate (SOLU-CORTEF) inj  50 mg Intravenous Q8H  . insulin aspart  0-9 Units Subcutaneous Q4H  . levothyroxine  125 mcg Per Tube Q0600  . mouth rinse  15 mL Mouth Rinse 10 times per day  . mupirocin ointment  1 application Nasal BID  . sodium chloride flush  10-40 mL Intracatheter Q12H   . sodium chloride HYPERTONIC  4 mL Nebulization BID  . vitamin C  500 mg Per Tube Daily  . zinc sulfate  220 mg Per Tube Daily   Continuous Infusions: . sodium chloride 100 mL/hr at 04/09/19 1237  . sodium chloride Stopped (04/09/19 0634)  . ceFEPime (MAXIPIME) IV Stopped (04/09/19 0240)  . DOPamine 1.5 mcg/kg/min (04/09/19 1300)  . feeding supplement (VITAL HIGH PROTEIN) 20 mL/hr at 04/08/19 0230  . fentaNYL infusion INTRAVENOUS 125 mcg/hr (04/09/19 1300)  . midazolam 2 mg/hr (04/09/19 1300)  . norepinephrine (LEVOPHED) Adult infusion Stopped (04/08/19 2144)  . potassium PHOSPHATE IVPB (in mmol)    . remdesivir 100 mg in NS 250 mL Stopped (04/08/19 2155)  . vancomycin Stopped (04/08/19 1351)   PRN Meds:.chlorpheniramine-HYDROcodone, fentaNYL, midazolam **OR** midazolam, sodium chloride flush, vecuronium  Antibiotics  :    Anti-infectives (From admission, onward)   Start     Dose/Rate Route Frequency Ordered Stop   04/07/19 1200  vancomycin (VANCOCIN) 2,000 mg in sodium chloride 0.9 % 500 mL IVPB     2,000 mg 250 mL/hr over 120 Minutes Intravenous Every 24 hours 04/07/19 1126     04/06/19 2200  remdesivir 100 mg in sodium chloride 0.9 % 250 mL IVPB     100 mg 500 mL/hr over 30 Minutes Intravenous Every 24 hours 04/26/2019 2206 04/10/19 2159   04/06/19 0800  vancomycin (VANCOCIN) 1,250 mg in sodium chloride 0.9 % 250 mL IVPB  Status:  Discontinued     1,250 mg 166.7 mL/hr over 90 Minutes Intravenous Every 24 hours 04/12/2019 2225 04/07/19 1126   04/06/19 0600  ceFEPIme (MAXIPIME) 2 g in sodium chloride 0.9 % 100 mL IVPB     2 g 200 mL/hr over 30 Minutes Intravenous Every 8 hours 05/05/2019 2225     05/01/2019 2300  remdesivir 200 mg in sodium chloride 0.9 % 250 mL IVPB     200 mg 500 mL/hr over 30 Minutes Intravenous Once 04/29/2019 2206 04/25/2019 2334   04/26/2019 2030  vancomycin (VANCOCIN) 2,500 mg in sodium chloride 0.9 % 500 mL IVPB  Status:  Discontinued     2,500 mg 250 mL/hr over  120 Minutes Intravenous  Once 04/20/2019 1926 04/21/2019 2116   04/18/2019 2000  ceFEPIme (MAXIPIME) 2 g in sodium chloride 0.9 % 100 mL IVPB     2 g 200 mL/hr over 30 Minutes Intravenous  Once 04/16/2019 1926 04/12/2019 2112   04/07/2019 2000  metroNIDAZOLE (FLAGYL) IVPB 500 mg  Status:  Discontinued     500 mg 100 mL/hr over 60 Minutes Intravenous Every 8 hours 04/22/2019 1926 04/08/19 1127       Time Spent in minutes  52   The patient is critically ill with multiple organ system failure and requires high complexity decision making for assessment and support, frequent evaluation and titration of therapies, advanced monitoring, review of radiographic studies and interpretation of complex data.    Oren Binet M.D on 04/09/2019 at 2:11 PM  To page go to [email or web address] - use universal password  [facility] -  Office  [phone]  See all Orders from today for further details   Admit date - 05/01/2019    4    Objective:   Vitals:   04/09/19 1209 04/09/19 1300 04/09/19 1315 04/09/19 1330  BP: (!) 97/48     Pulse: (!) 59 (!) 58 (!) 56 60  Resp: (!) 35 (!) 35 (!) 35 (!) 35  Temp: 100.2 F (37.9 C)  (!) 100.4 F (38 C) 100.2  F (37.9 C)  TempSrc:      SpO2: 100% 100% 100% 100%  Weight:      Height:        Wt Readings from Last 3 Encounters:  04/08/19 (!) 141.4 kg     Intake/Output Summary (Last 24 hours) at 04/09/2019 1411 Last data filed at 04/09/2019 1300 Gross per 24 hour  Intake 3249.48 ml  Output 7415 ml  Net -4165.52 ml     Physical Exam General appearance: Intubated sedated Eyes:no scleral icterus. HEENT: Atraumatic and Normocephalic Neck: supple, no JVD. Resp:Good air entry bilaterally,no r added sounds heard anteriorly CVS: S1 S2 regular, no murmurs.  GI: Bowel sounds present, Non tender and not distended with no gaurding, rigidity or rebound. Extremities: B/L Lower Ext shows no edema, both legs are warm to touch    Data Review:    CBC Recent Labs   Lab 04/27/2019 2015  04/06/19 0310  04/07/19 0500  04/08/19 0500 04/08/19 1321 04/08/19 1528 04/09/19 0419 04/09/19 0812  WBC 16.9*  --  14.7*  --  9.6  --  8.2  --   --  7.4  --   HGB 12.4   < > 11.4*   < > 9.5*   < > 10.2* 12.9 11.2* 10.3* 10.9*  HCT 44.3   < > 42.5   < > 35.9*   < > 38.5 38.0 33.0* 37.4 32.0*  PLT 191  --  192  --  152  --  171  --   --  165  --   MCV 94.1  --  94.7  --  95.7  --  94.6  --   --  93.7  --   MCH 26.3  --  25.4*  --  25.3*  --  25.1*  --   --  25.8*  --   MCHC 28.0*  --  26.8*  --  26.5*  --  26.5*  --   --  27.5*  --   RDW 17.2*  --  17.3*  --  17.7*  --  17.8*  --   --  18.1*  --   LYMPHSABS 0.9  --  0.8  --  0.8  --  0.9  --   --  1.0  --   MONOABS 1.2*  --  1.1*  --  0.5  --  0.5  --   --  0.5  --   EOSABS 0.0  --  0.0  --  0.0  --  0.0  --   --  0.1  --   BASOSABS 0.0  --  0.0  --  0.0  --  0.0  --   --  0.1  --    < > = values in this interval not displayed.    Chemistries  Recent Labs  Lab 05/04/2019 2015  04/06/19 0310  04/07/19 0500  04/07/19 1719 04/08/19 0500 04/08/19 1321 04/08/19 1528 04/08/19 2222 04/09/19 0419 04/09/19 0812  NA 153*   < > 154*   < > 142   < >  --  152* 155* 156* 155* 161* 168*  K 4.8   < > 4.5   < > 3.7   < >  --  3.9 3.8 3.6 3.5 3.4* 3.1*  CL 116*  --  116*  --  110  --   --  118*  --   --  124* 128*  --   CO2 26  --  30  --  24  --   --  28  --   --  26 26  --   GLUCOSE 117*  --  127*  --  415*  --   --  154*  --   --  120* 115*  --   BUN 20  --  20  --  21*  --   --  27*  --   --  31* 29*  --   CREATININE 1.47*  --  1.20*  --  0.78  --   --  0.76  --   --  0.86 0.84  --   CALCIUM 7.6*  --  7.6*  --  6.5*  --   --  7.4*  --   --  7.3* 7.7*  --   MG 1.7  --   --    < > 1.8  --  2.3 2.5*  --   --  2.3 2.4  --   AST 92*  --  100*  --  66*  --   --  51*  --   --   --  51*  --   ALT 34  --  35  --  28  --   --  29  --   --   --  31  --   ALKPHOS 65  --  58  --  50  --   --  58  --   --   --  64  --   BILITOT  0.6  --  0.4  --  0.1*  --   --  0.7  --   --   --  0.3  --    < > = values in this interval not displayed.   ------------------------------------------------------------------------------------------------------------------ No results for input(s): CHOL, HDL, LDLCALC, TRIG, CHOLHDL, LDLDIRECT in the last 72 hours.  No results found for: HGBA1C ------------------------------------------------------------------------------------------------------------------ Recent Labs    04/08/19 2222  TSH <0.010*   ------------------------------------------------------------------------------------------------------------------ Recent Labs    04/08/19 0500 04/09/19 0419  FERRITIN 202 195    Coagulation profile Recent Labs  Lab 04/28/2019 2015  INR 1.2    Recent Labs    04/08/19 0500 04/09/19 0419  DDIMER 2.43* 2.92*    Cardiac Enzymes No results for input(s): CKMB, TROPONINI, MYOGLOBIN in the last 168 hours.  Invalid input(s): CK ------------------------------------------------------------------------------------------------------------------ No results found for: BNP  Micro Results Recent Results (from the past 240 hour(s))  MRSA PCR Screening     Status: Abnormal   Collection Time: 04/06/19  2:45 AM   Specimen: Nasopharyngeal  Result Value Ref Range Status   MRSA by PCR POSITIVE (A) NEGATIVE Final    Comment:        The GeneXpert MRSA Assay (FDA approved for NASAL specimens only), is one component of a comprehensive MRSA colonization surveillance program. It is not intended to diagnose MRSA infection nor to guide or monitor treatment for MRSA infections. RESULT CALLED TO, READ BACK BY AND VERIFIED WITH: Arta Silence [id number] @ 9163 BY J SCOTTON Performed at [facility] [address], [place], [place] [address]   Culture, respiratory (non-expectorated)     Status: None   Collection Time: 04/06/19 11:36 AM   Specimen: Tracheal Aspirate;  Respiratory  Result Value Ref Range Status   Specimen Description   Final    TRACHEAL ASPIRATE Performed at [facility] [address] [place], [place] [address]    Special Requests   Final  NONE Performed at [facility], [place] [address], [place], [place] [address]    Gram Stain   Final    ABUNDANT WBC PRESENT,BOTH PMN AND MONONUCLEAR NO ORGANISMS SEEN Performed at [facility], [place] [address] [place], [place] [address]    Culture RARE METHICILLIN RESISTANT STAPHYLOCOCCUS AUREUS  Final   Report Status 04/09/2019 FINAL  Final   Organism ID, Bacteria METHICILLIN RESISTANT STAPHYLOCOCCUS AUREUS  Final      Susceptibility   Methicillin resistant staphylococcus aureus - MIC*    CIPROFLOXACIN >=8 RESISTANT Resistant     ERYTHROMYCIN >=8 RESISTANT Resistant     GENTAMICIN <=0.5 SENSITIVE Sensitive     OXACILLIN >=4 RESISTANT Resistant     TETRACYCLINE <=1 SENSITIVE Sensitive     VANCOMYCIN 1 SENSITIVE Sensitive     TRIMETH/SULFA <=10 SENSITIVE Sensitive     CLINDAMYCIN <=0.25 SENSITIVE Sensitive     RIFAMPIN <=0.5 SENSITIVE Sensitive     Inducible Clindamycin NEGATIVE Sensitive     * RARE METHICILLIN RESISTANT STAPHYLOCOCCUS AUREUS    Radiology Reports Ct Head Wo Contrast  Result Date: 04/06/2019 CLINICAL DATA:  48 y/o F; unexplained altered level of consciousness. Found unresponsive. Sepsis and COVID-19 positive. EXAM: CT HEAD WITHOUT CONTRAST TECHNIQUE: Contiguous axial images were obtained from the base of the skull through the vertex without intravenous contrast. COMPARISON:  None. FINDINGS: Brain: No evidence of acute infarction, hemorrhage, hydrocephalus, extra-axial collection or mass effect. Coarse calcifications are present which appear to be centered in the region of hypothalamus and mamillary bodies. Vascular: No hyperdense vessel or unexpected calcification. Skull: Normal. Negative for fracture or focal lesion. Sinuses/Orbits:  Right maxillary sinus mucosal thickening. Large left maxillary sinus mucous retention cyst or polyp. Additional included paranasal sinuses and the mastoid air cells are normally aerated. Nasoenteric tube and endotracheal tube noted. Orbits are unremarkable. Other: None. IMPRESSION: 1. No acute intracranial abnormality identified. 2. Coarse calcifications which appears centered in the region of hypothalamus and mamillary bodies. Findings may represent a small underlying mass such as craniopharyngioma, glioma, and hamartoma or possibly sequelae of prior toxic/inflammatory process. Consider MRI of the brain without and with contrast to better characterize. Electronically Signed   By: Kristine Garbe M.D.   On: 04/06/2019 02:24   Dg Chest Port 1 View  Result Date: 04/08/2019 CLINICAL DATA:  Hypoxia. EXAM: PORTABLE CHEST 1 VIEW COMPARISON:  Radiograph same day. FINDINGS: Stable cardiomediastinal silhouette. Endotracheal and nasogastric tubes are unchanged in position. Left internal jugular catheter is unchanged in position. No pneumothorax is noted. Right-sided PICC line is noted with distal tip in expected position of cavoatrial junction. Stable diffuse lung opacities are noted most consistent with pneumonia or edema. Small bilateral pleural effusions may be present. Bony thorax is unremarkable. IMPRESSION: Interval placement of right-sided PICC line with distal tip in expected position of cavoatrial junction. Otherwise stable support apparatus. Stable bilateral lung opacities are noted concerning for pneumonia or edema with possible small pleural effusions. Electronically Signed   By: Marijo Conception M.D.   On: 04/08/2019 15:22   Dg Chest Port 1 View  Result Date: 04/08/2019 CLINICAL DATA:  Worsening oxygenation. EXAM: PORTABLE CHEST 1 VIEW COMPARISON:  Chest radiograph 04/06/2019 FINDINGS: ET tube mid trachea. Left IJ central venous catheter tip projects over the right atrium. Enteric tube side port  terminates in the mid esophagus. Stable cardiomegaly. Monitoring leads overlie the patient. Similar-appearing diffuse bilateral airspace opacities. No large pleural effusion or pneumothorax. Thoracic spine degenerative changes. IMPRESSION:  ET tube side-port projects in the midesophagus, recommend advancement. Otherwise similar-appearing bilateral diffuse airspace opacities. Electronically Signed   By: Lovey Newcomer M.D.   On: 04/08/2019 11:41   Dg Chest Port 1 View  Result Date: 04/06/2019 CLINICAL DATA:  Shortness of breath. EXAM: PORTABLE CHEST 1 VIEW COMPARISON:  04/06/2019. FINDINGS: Endotracheal tube, NG tube, left IJ line stable position. Stable cardiomegaly. Diffuse severe bilateral pulmonary infiltrates/edema again noted. No prominent pleural effusion. No pneumothorax. IMPRESSION: 1.  Lines and tubes stable position. 2. Stable cardiomegaly. Diffuse severe bilateral pulmonary infiltrates/edema again noted. No interim change. Electronically Signed   By: Marcello Moores  Register   On: 04/06/2019 08:42   Dg Chest Port 1 View  Result Date: 04/06/2019 CLINICAL DATA:  Respiratory failure.  COVID-19 positive. EXAM: PORTABLE CHEST 1 VIEW COMPARISON:  No recent prior. FINDINGS: Left IJ line noted with tip over cavoatrial junction. NG tube noted with tip below left hemidiaphragm. Cardiomegaly. Diffuse bilateral pulmonary infiltrates/edema. Tiny left effusion cannot be excluded. No pneumothorax. IMPRESSION: 1. Left IJ line noted with tip over cavoatrial junction. NG tube noted with tip below left hemidiaphragm. 2.  Cardiomegaly. 3. Severe diffuse bilateral pulmonary infiltrates/edema. Tiny left pleural effusion cannot be excluded. Electronically Signed   By: Marcello Moores  Register   On: 04/06/2019 07:36   Dg Abd Portable 1v  Result Date: 04/20/2019 CLINICAL DATA:  Check gastric catheter placement EXAM: PORTABLE ABDOMEN - 1 VIEW COMPARISON:  None. FINDINGS: Scattered large and small bowel gas is noted. Gastric catheter is noted  within the distal aspect of the stomach. Patchy infiltrates are noted within the lungs bilaterally. IMPRESSION: Gastric catheter within the stomach. Electronically Signed   By: Inez Catalina M.D.   On: 04/27/2019 21:55   [place] Ekg Site Rite  Result Date: 04/07/2019 If Site Rite image not attached, placement could not be confirmed due to current cardiac rhythm.

## 2019-04-10 ENCOUNTER — Inpatient Hospital Stay (HOSPITAL_COMMUNITY): Payer: Medicaid Other

## 2019-04-10 ENCOUNTER — Other Ambulatory Visit: Payer: Self-pay

## 2019-04-10 DIAGNOSIS — E870 Hyperosmolality and hypernatremia: Secondary | ICD-10-CM

## 2019-04-10 LAB — CBC WITH DIFFERENTIAL/PLATELET
Abs Immature Granulocytes: 0.47 10*3/uL — ABNORMAL HIGH (ref 0.00–0.07)
Basophils Absolute: 0 10*3/uL (ref 0.0–0.1)
Basophils Relative: 1 %
Eosinophils Absolute: 0 10*3/uL (ref 0.0–0.5)
Eosinophils Relative: 0 %
HCT: 38.8 % (ref 36.0–46.0)
Hemoglobin: 10.5 g/dL — ABNORMAL LOW (ref 12.0–15.0)
Immature Granulocytes: 6 %
Lymphocytes Relative: 19 %
Lymphs Abs: 1.5 10*3/uL (ref 0.7–4.0)
MCH: 25.5 pg — ABNORMAL LOW (ref 26.0–34.0)
MCHC: 27.1 g/dL — ABNORMAL LOW (ref 30.0–36.0)
MCV: 94.4 fL (ref 80.0–100.0)
Monocytes Absolute: 0.9 10*3/uL (ref 0.1–1.0)
Monocytes Relative: 12 %
Neutro Abs: 5 10*3/uL (ref 1.7–7.7)
Neutrophils Relative %: 62 %
Platelets: 215 10*3/uL (ref 150–400)
RBC: 4.11 MIL/uL (ref 3.87–5.11)
RDW: 18 % — ABNORMAL HIGH (ref 11.5–15.5)
WBC: 7.9 10*3/uL (ref 4.0–10.5)
nRBC: 3.7 % — ABNORMAL HIGH (ref 0.0–0.2)

## 2019-04-10 LAB — COMPREHENSIVE METABOLIC PANEL
ALT: 27 U/L (ref 0–44)
AST: 37 U/L (ref 15–41)
Albumin: 2.1 g/dL — ABNORMAL LOW (ref 3.5–5.0)
Alkaline Phosphatase: 62 U/L (ref 38–126)
Anion gap: 5 (ref 5–15)
BUN: 25 mg/dL — ABNORMAL HIGH (ref 6–20)
CO2: 31 mmol/L (ref 22–32)
Calcium: 7.4 mg/dL — ABNORMAL LOW (ref 8.9–10.3)
Chloride: 128 mmol/L — ABNORMAL HIGH (ref 98–111)
Creatinine, Ser: 0.76 mg/dL (ref 0.44–1.00)
GFR calc Af Amer: >60 mL/min (ref >60)
GFR calc non Af Amer: >60 mL/min (ref >60)
Glucose, Bld: 88 mg/dL (ref 70–99)
Potassium: 3.3 mmol/L — ABNORMAL LOW (ref 3.5–5.1)
Sodium: 164 mmol/L (ref 135–145)
Total Bilirubin: 0.6 mg/dL (ref 0.3–1.2)
Total Protein: 6.1 g/dL — ABNORMAL LOW (ref 6.5–8.1)

## 2019-04-10 LAB — CBC
HCT: 36.1 % (ref 36.0–46.0)
Hemoglobin: 10 g/dL — ABNORMAL LOW (ref 12.0–15.0)
MCH: 26.1 pg (ref 26.0–34.0)
MCHC: 27.7 g/dL — ABNORMAL LOW (ref 30.0–36.0)
MCV: 94.3 fL (ref 80.0–100.0)
Platelets: 230 10*3/uL (ref 150–400)
RBC: 3.83 MIL/uL — ABNORMAL LOW (ref 3.87–5.11)
RDW: 18.1 % — ABNORMAL HIGH (ref 11.5–15.5)
WBC: 10.3 10*3/uL (ref 4.0–10.5)
nRBC: 4.3 % — ABNORMAL HIGH (ref 0.0–0.2)

## 2019-04-10 LAB — POCT I-STAT 7, (LYTES, BLD GAS, ICA,H+H)
Acid-Base Excess: 4 mmol/L — ABNORMAL HIGH (ref 0.0–2.0)
Bicarbonate: 30.6 mmol/L — ABNORMAL HIGH (ref 20.0–28.0)
Calcium, Ion: 1.1 mmol/L — ABNORMAL LOW (ref 1.15–1.40)
HCT: 33 % — ABNORMAL LOW (ref 36.0–46.0)
Hemoglobin: 11.2 g/dL — ABNORMAL LOW (ref 12.0–15.0)
O2 Saturation: 93 %
Potassium: 3.7 mmol/L (ref 3.5–5.1)
Sodium: 160 mmol/L — ABNORMAL HIGH (ref 135–145)
TCO2: 32 mmol/L (ref 22–32)
pCO2 arterial: 54.8 mmHg — ABNORMAL HIGH (ref 32.0–48.0)
pH, Arterial: 7.354 (ref 7.350–7.450)
pO2, Arterial: 72 mmHg — ABNORMAL LOW (ref 83.0–108.0)

## 2019-04-10 LAB — D-DIMER, QUANTITATIVE: D-Dimer, Quant: 6.78 ug/mL-FEU — ABNORMAL HIGH (ref 0.00–0.50)

## 2019-04-10 LAB — FERRITIN: Ferritin: 131 ng/mL (ref 11–307)

## 2019-04-10 LAB — PHOSPHORUS: Phosphorus: 2.8 mg/dL (ref 2.5–4.6)

## 2019-04-10 LAB — GLUCOSE, CAPILLARY
Glucose-Capillary: 111 mg/dL — ABNORMAL HIGH (ref 70–99)
Glucose-Capillary: 123 mg/dL — ABNORMAL HIGH (ref 70–99)
Glucose-Capillary: 133 mg/dL — ABNORMAL HIGH (ref 70–99)
Glucose-Capillary: 151 mg/dL — ABNORMAL HIGH (ref 70–99)
Glucose-Capillary: 153 mg/dL — ABNORMAL HIGH (ref 70–99)
Glucose-Capillary: 76 mg/dL (ref 70–99)
Glucose-Capillary: 90 mg/dL (ref 70–99)

## 2019-04-10 LAB — C-REACTIVE PROTEIN: CRP: 3.1 mg/dL — ABNORMAL HIGH (ref <1.0)

## 2019-04-10 LAB — MAGNESIUM: Magnesium: 2 mg/dL (ref 1.7–2.4)

## 2019-04-10 LAB — VANCOMYCIN, TROUGH: Vancomycin Tr: 15 ug/mL (ref 15–20)

## 2019-04-10 LAB — VANCOMYCIN, PEAK: Vancomycin Pk: 56 ug/mL (ref 30–40)

## 2019-04-10 MED ORDER — PRO-STAT SUGAR FREE PO LIQD
30.0000 mL | Freq: Two times a day (BID) | ORAL | Status: DC
  Administered 2019-04-10 – 2019-04-13 (×6): 30 mL
  Filled 2019-04-10 (×7): qty 30

## 2019-04-10 MED ORDER — SODIUM CHLORIDE 0.9 % IV SOLN
100.0000 mg | INTRAVENOUS | Status: DC
  Administered 2019-04-10 – 2019-04-11 (×2): 100 mg via INTRAVENOUS
  Filled 2019-04-10 (×2): qty 20

## 2019-04-10 MED ORDER — DEXTROSE 5 % IV SOLN
INTRAVENOUS | Status: DC
  Administered 2019-04-10: 08:00:00 via INTRAVENOUS

## 2019-04-10 MED ORDER — FREE WATER
400.0000 mL | Freq: Four times a day (QID) | Status: DC
  Administered 2019-04-10 – 2019-04-13 (×12): 400 mL

## 2019-04-10 MED ORDER — DESMOPRESSIN ACETATE 4 MCG/ML IJ SOLN
2.0000 ug | Freq: Three times a day (TID) | INTRAMUSCULAR | Status: DC
  Administered 2019-04-10 – 2019-04-13 (×10): 2 ug via SUBCUTANEOUS
  Filled 2019-04-10 (×11): qty 1

## 2019-04-10 MED ORDER — POTASSIUM CHLORIDE CRYS ER 20 MEQ PO TBCR: 40.0000 meq | EXTENDED_RELEASE_TABLET | Freq: Once | ORAL | Status: DC

## 2019-04-10 MED ORDER — POTASSIUM CHLORIDE 20 MEQ/15ML (10%) PO SOLN
40.0000 meq | Freq: Once | ORAL | Status: AC
  Administered 2019-04-10: 14:00:00 40 meq
  Filled 2019-04-10: qty 30

## 2019-04-10 MED ORDER — VITAL AF 1.2 CAL PO LIQD
1000.0000 mL | ORAL | Status: DC
  Administered 2019-04-10 – 2019-04-12 (×3): 1000 mL
  Filled 2019-04-10 (×2): qty 1000

## 2019-04-10 MED ORDER — DEXTROSE-NACL 5-0.45 % IV SOLN
INTRAVENOUS | Status: DC
  Administered 2019-04-10: 05:00:00 via INTRAVENOUS

## 2019-04-10 MED ORDER — ATROPINE SULFATE 1 MG/10ML IJ SOSY
0.5000 mg | PREFILLED_SYRINGE | INTRAMUSCULAR | Status: DC | PRN
  Filled 2019-04-10: qty 10

## 2019-04-10 NOTE — Progress Notes (Signed)
ABG obtained 7.35/54.8/72/30.6 Vent Settings 400/35/14/90. CCM MD paged and secure chat sent due to worry that pt is unstable as HR is 42-46 and re-proning at this time could possibly be harmful. Will wait to hear from CCM MD if we are to re-prone her.

## 2019-04-10 NOTE — Progress Notes (Signed)
Daughter called and updated (FACE TIME). All questions are answered. Emotional support was given.  (Please updated daily)

## 2019-04-10 NOTE — Progress Notes (Signed)
NAME:  Christine Patterson, MRN:  [id number], DOB:  [birth year], LOS: 5 ADMISSION DATE:  04/28/2019, CONSULTATION DATE:  7/2 REFERRING MD:  Dr. Candiss Norse, CHIEF COMPLAINT:  dyspnea  Brief History   48 y/o female with a complex medical history admitted for ARDS from COVID pneumonia.  There was also concern for aspiration pneumonia on admission.    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 Prone overnight July 4 > mucus plug after moving to supine position  Severe acute hypoxemia again later  Emergent bronchoscopy    Consults:  PCCM  Procedures:  7/1 ETT >  7/2 L IJ CVL >  7/4 Emergent bronchoscopy: mucus plugging left lower lobe, emergent bedside echo:  normal LV contractility, no pericardial fluid, dilated, hypocontractile RV  Significant Diagnostic Tests:  12/2018 HRCT showed findings worrisome for pulmonary parenchymal disease from sarcoidosis, likely pulmonary hypertension 11/2017 TTE> moderate LVH, LVEF normal, RV enlarted, hypocontractile free wall, mild global decrease, RVSP 38 mmHg; atrial shunting noted  Micro Data:  6/30 SARS-COV-2> positive 6/30 blood culture OSH > 1/4 coag neg staph 7/1 urine culture > klebsiella 7/2 resp culture   Antimicrobials:  7/1  cefepime>  7/1 flagyl > 7/4 7/1 remdesivir >  7/1vancomycin >  7/1 hydrocortisone >   Interim history/subjective:    7/5  - remains unresponsive. Was bradycardic and switched levophed to dopamine. MAP now 68 with sbp 98.  On fent gtt125 and versed 2mg . Significant DI with high urinary volume and hypernatremia. Pressor need felt due to be volume loss -> DDAVP changed from oral to subcut -> Urine Op improved. On 70% fio2, 18 peep -> Po2 82.  Currently remains on vanc and cefepime; both day 5  K and phos severely low - ordered opharm to repete  On Rx dose lovenox  Objective   Blood pressure (!) 133/98, pulse 60, temperature 98.5 F (36.9 C), temperature source Oral, resp. rate (!) 35, height 5\' 2"  (1.575 m), weight (!) 148.2 kg, SpO2 95 %.    Vent Mode: PRVC FiO2 (%):  [40 %-70 %] 40 % Set Rate:  [35 bmp] 35 bmp Vt Set:  [400 mL] 400 mL PEEP:  [14 cmH20-18 cmH20] 14 cmH20 Plateau Pressure:  [26 cmH20-33 cmH20] 26 cmH20   Intake/Output Summary (Last 24 hours) at 04/10/2019 1250 Last data filed at 04/10/2019 1100 Gross per 24 hour  Intake 5127.33 ml  Output 2775 ml  Net 2352.33 ml   Filed Weights   04/07/19 0500 04/08/19 0500 04/10/19 0500  Weight: 134.6 kg (!) 141.4 kg (!) 148.2 kg    Examination: General Appearance:  Looks criticall ill OBESE - + , SUPINE + Head:  Normocephalic, without obvious abnormality, atraumatic Eyes:  PERRL - yes, conjunctiva/corneas - muddy     Ears:  Normal external ear canals, both ears Nose:  G tube - no Throat:  ETT TUBE - yds , OG tube - yes Neck:  Supple,  No enlargement/tenderness/nodules Lungs: Clear to auscultation bilaterally, Ventilator   Synchrony - yes Heart:  S1 and S2 normal, no murmur, CVP - no.  Pressors - yes Abdomen:  Soft, no masses, no organomegaly Genitalia / Rectal:  Not done Extremities:  Extremities- intact Skin:  ntact in exposed areas . Sacral area - not examined Neurologic:  Sedation - fent gtt, versed gtt -> RASS -  -4      LABS    PULMONARY Recent Labs  Lab 04/06/19 1721 04/07/19 1622 04/08/19 1321 04/08/19 1528 04/09/19 0812  PHART 7.257* 7.342* 7.131* 7.260* 7.397  PCO2ART 64.8* 48.9* 69.4* 57.1* 48.3*  PO2ART 90.0 63.0* 32.0* 71.0* 82.0*  HCO3 28.9* 26.7 23.4 25.6 30.1*  TCO2 32  O2SAT 95.0 91.0 45.0 91.0 96.0    CBC Recent Labs  Lab 04/08/19 0500  04/09/19 0419 04/09/19 0812 04/10/19 0450  HGB 10.2*   < > 10.3* 10.9* 10.5*  HCT 38.5   < > 37.4 32.0* 38.8  WBC 8.2  --  7.4  --  7.9  PLT 171  --  165  --  215   < > = values in this interval not displayed.    COAGULATION Recent Labs  Lab 04-27-19 2015  INR 1.2    CARDIAC  No results for input(s): TROPONINI in the last 168 hours. No results for input(s): PROBNP in the last 168 hours.   CHEMISTRY Recent Labs  Lab 04/07/19 0500  04/07/19 1719 04/08/19 0500  04/08/19 1528 04/08/19 2222 04/09/19 0419 04/09/19 0812 04/10/19 0450  NA 142   < >  --  152*   < > 156* 155* 161* 168* 164*  K 3.7   < >  --  3.9   < > 3.6 3.5 3.4* 3.1* 3.3*  CL 110  --   --  118*  --   --  124* 128*  --  128*  CO2 24  --   --  28  --   --  26 26  --  31  GLUCOSE 415*  --   --  154*  --   --  120* 115*  --  88  BUN 21*  --   --  27*  --   --  31* 29*  --  25*  CREATININE 0.78  --   --  0.76  --   --  0.86 0.84  --  0.76  CALCIUM 6.5*  --   --  7.4*  --   --  7.3* 7.7*  --  7.4*  MG 1.8  --  2.3 2.5*  --   --  2.3 2.4  --  2.0  PHOS 1.1*  --  1.0* 1.7*  --   --   --  1.2*  --  2.8   < > = values in this interval not displayed.   Estimated Creatinine Clearance: 118.6 mL/min (by C-G formula based on SCr of 0.76 mg/dL).   LIVER Recent Labs  Lab April 27, 2019 2015 04/06/19 0310 04/07/19 0500 04/08/19 0500 04/09/19 0419 04/10/19 0450  AST 92* 100* 66* 51* 51* 37  ALT 34 35 ALKPHOS 65 58 50 58 64 62  BILITOT 0.6 0.4 0.1* 0.7 0.3 0.6  PROT 7.0 6.8 5.4* 6.4* 6.3* 6.1*  ALBUMIN 2.5* 2.4* 1.8* 2.2* 2.2* 2.1*    INR 1.2  --   --   --   --   --  INFECTIOUS Recent Labs  Lab 04/08/2019 2015 04/26/2019 2250  04/07/19 0500 04/08/19 0500 04/09/19 0419  LATICACIDVEN 1.4 1.2  --   --   --   --   PROCALCITON  --   --    < > 1.23 0.68 0.50   < > = values in this interval not displayed.     ENDOCRINE CBG (last 3)  Recent Labs    04/09/19 2325 04/10/19 0407 04/10/19 0742  GLUCAP 84 76 90         IMAGING x48h  - image(s) personally visualized  -   highlighted in bold Dg Abd 1 View  Result Date: 04/10/2019 CLINICAL DATA:  Nasogastric tube placement EXAM: ABDOMEN - 1 VIEW COMPARISON:  Plain film of the abdomen dated 04/21/2019. FINDINGS: Nasogastric tube appears adequately positioned in the stomach. Visualized bowel gas pattern is nonobstructive. IMPRESSION: Nasogastric tube appears adequately positioned in the stomach. Electronically Signed   By: Bary RichardStan  Maynard M.D.   On: 04/10/2019 11:26   Dg Chest Port 1 View  Result Date: 04/08/2019 CLINICAL DATA:  Hypoxia. EXAM: PORTABLE CHEST 1 VIEW COMPARISON:  Radiograph same day. FINDINGS: Stable cardiomediastinal silhouette. Endotracheal and nasogastric tubes are unchanged in position. Left internal jugular catheter is unchanged in position. No pneumothorax is noted. Right-sided PICC line is noted with distal tip in expected position of cavoatrial junction. Stable diffuse lung opacities are noted most consistent with pneumonia or edema. Small bilateral pleural effusions may be present. Bony thorax is unremarkable. IMPRESSION: Interval placement of right-sided PICC line with distal tip in expected position of cavoatrial junction. Otherwise stable support apparatus. Stable bilateral lung opacities are noted concerning for pneumonia or edema with possible small pleural effusions. Electronically Signed   By: Lupita RaiderJames  Green Jr M.D.   On: 04/08/2019 15:22     Resolved Hospital Problem list     Assessment & Plan:  ARDS due to COVID 19 pneumonia: Mucus  plugging left lower lobe 7/4 Baseline atrial shunt  Baseline pulmonary hypertension and atrial shunting, now in acute decompensated cor-pulmonal from severe acute respiratory failure ? Pulmonary embolism given COVID, acute on chronic pulmonary hypertension    04/10/2019 - > does not meet criteria for SBT/Extubation in setting of Acute Respiratory Failure due to high peep/fio2 and pressor need  PLAN - ARDS protocol - re-prone 04/09/2019 - high peep needed due to covid and ARDS - continue guiafenesin - continue hypertonic saline - Antivirals (remdesivir) and hydrocortisone to continue - No Tocilizumab due to bacterial infection and immunocompromised state - Rx dose lovenox  for empiric treatment of pulmonary embolism  Circulatory shock with bradycardia - fluid bolus  -dopamine - hydrocort due to pan hypopiot  Electrolyte imbalance  - replete phos and K  Aspiration pneumonia Continue cefepime, 5 day course  UTI (Klebsiella OSH) cefepime  Immunocompromised As above  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  Best practice:  Diet: tube feeding Pain/Anxiety/Delirium protocol (if indicated): RASS -4 to -5 per intermittent vecuronium protocol VAP protocol (if indicated): yes DVT prophylaxis: yes GI prophylaxis: famotidine Glucose control: SSI Mobility: bed rest Code Status: DNR but continue full medical support Family Communication:   7/4 - I had multiple lengthy conversations with the patient's family today including her sister [name] as well as her daughters [name].  I explained that [name] is severely  ill from COVID and requiring high levels of life support to stay alive.  I explained that I thought that her overall prognosis was poor but that I did not  think it was unreasonable to continue to support her and hope for a recovery.  I did explain that in the event of cardiac arrest CPR, electric shocks and other interventions would be medically nonbeneficial and would cause more harm than good.  They voiced understanding and agreed to a DNR status in the event of a cardiac arrest.  We will continue full mechanical ventilatory support and other forms of medical support in the meantime. Disposition: remain in ICU   7/5 - called sister belinda listed number of [phone] x 2 - says ":call cannot be completed"      ATTESTATION & SIGNATURE   The patient Christine Patterson is critically ill with multiple organ systems failure and requires high complexity decision making for assessment and support, frequent evaluation and titration of therapies, application of advanced monitoring technologies and extensive interpretation of multiple databases.   Critical Care Time devoted to patient care services described in this note is  30  Minutes. This time reflects time of care of this signee Dr Kalman ShanMurali Bunnie Rehberg. This critical care time does not reflect procedure time, or teaching time or supervisory time of PA/NP/Med student/Med Resident etc but could involve care discussion time     Dr. Kalman ShanMurali Ervan Heber, M.D., [facility].P Pulmonary and Critical Care Medicine Staff Physician  System [place] [facility] Pager: [phone], If no answer or between  15:00h - 7:00h: call [phone]  04/10/2019 12:50 PM    NAME:  Christine Patterson, MRN:  [id number], DOB:  10-Apr-1971, LOS: 5 ADMISSION DATE:  05/03/2019, CONSULTATION DATE:  7/2 REFERRING MD:  Dr. Thedore MinsSingh, CHIEF COMPLAINT:  dyspnea  Brief History   48 y/o female with a complex medical history admitted for ARDS from COVID pneumonia.  There was also concern for aspiration pneumonia on admission.    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 Prone overnight July 4 > mucus plug after moving to supine position  Severe acute hypoxemia again later  Emergent bronchoscopy    Consults:  PCCM  Procedures:  7/1 ETT >  7/2 L IJ CVL >  7/4 Emergent bronchoscopy: mucus plugging left lower lobe, emergent bedside echo:  normal LV contractility, no pericardial fluid, dilated, hypocontractile RV  7/5 - remains unresponsive. Was bradycardic and switched levophed to dopamine. MAP now 68 with sbp 98.  On fent gtt125 and versed 2mg . Significant DI with high urinary volume and hypernatremia. Pressor need felt due to be volume loss -> DDAVP changed from oral to subcut -> Urine Op improved. On 70% fio2, 18 peep -> Po2 82.  Currently remains on vanc and cefepime; both day 5.K and phos severely low - ordered opharm to repete . On Rx dose lovenox  Significant Diagnostic Tests:  12/2018 HRCT showed findings worrisome for pulmonary parenchymal disease from sarcoidosis, likely pulmonary hypertension 11/2017 TTE> moderate LVH, LVEF normal, RV enlarted, hypocontractile free wall, mild global decrease, RVSP 38 mmHg; atrial shunting noted   Micro Data:  6/30 SARS-COV-2> positive 6/30 blood culture OSH > 1/4 coag neg staph 7/1 urine culture > klebsiella 7/2 resp culture   - MRSA   Antimicrobials:  7/1 cefepime>  7/1 flagyl > 7/4 7/1 remdesivir >  7/1vancomycin >  7/1 hydrocortisone >   Interim history/subjective:    04/10/2019 seen earlier today on rounds.  Patient was prone.  On fentanyl and Versed drips.  After fluid bolus she is off pressors.  Growing MRSA in sputum culture April 06, 2019.  The orogastric tube fell off when she was being prone and therefore she did not get free water.  Sodium is therefore high.  She continues to make a lot of urine.  Hospitalist plans to increase DDAVP and reinitiate free water.  Currently on 40% oxygen with a PEEP of 16 [lowered from 18].  Pulse ox 99%.  On prone ventilation  Objective   Blood pressure (!) 133/98, pulse 60, temperature 98.5 F (36.9 C), temperature source Oral, resp. rate (!) 35, height  (1.575 m), weight (!) 148.2 kg, SpO2 95 %.    Vent Mode: PRVC FiO2 (%):  [40 %-70 %] 40 % Set Rate:  [35 bmp] 35 bmp Vt Set:  [400 mL] 400 mL PEEP:  [14 cmH20-18 cmH20] 14 cmH20 Plateau Pressure:  [26 cmH20-33 cmH20] 26 cmH20   Intake/Output Summary (Last 24 hours) at 04/10/2019 1251 Last data filed at 04/10/2019 1100 Gross per 24 hour  Intake 5127.33 ml  Output 2775 ml  Net 2352.33 ml   Filed Weights   04/07/19 0500 04/08/19 0500 04/10/19 0500  Weight: 134.6 kg (!) 141.4 kg (!) 148.2 kg    Examination: General Appearance:  Looks criticall ill OBESE - + , PRONE + Head:  Normocephalic, without obvious abnormality, atraumatic Eyes:  PERRL - yes, conjunctiva/corneas - muddy     Ears:  Normal external ear canals, both ears Nose:  G tube - no Throat:  ETT TUBE - yds , OG tube - yes Neck:  Supple,  No enlargement/tenderness/nodules Lungs: Clear to auscultation bilaterally, Ventilator   Synchrony - yes Heart:  S1 and S2 normal, no murmur, CVP - no.  Pressors - yes Abdomen:  Soft, no masses, no organomegaly Genitalia / Rectal:  Not done Extremities:  Extremities- intact Skin:  ntact in exposed areas .  Sacral area - not examined Neurologic:  Sedation - fent gtt, versed gtt -> RASS - -4      LABS    PULMONARY Recent Labs  Lab 04/06/19 1721 04/07/19 1622 04/08/19 1321 04/08/19 1528 04/09/19 0812  PHART 7.257* 7.342* 7.131* 7.260* 7.397  PCO2ART 64.8* 48.9* 69.4* 57.1* 48.3*  PO2ART 90.0 63.0* 32.0* 71.0* 82.0*  HCO3 28.9* 26.7 23.4 25.6 30.1*  TCO2 32  O2SAT 95.0 91.0 45.0 91.0 96.0    CBC Recent Labs  Lab 04/08/19 0500  04/09/19 0419 04/09/19 0812 04/10/19 0450  HGB 10.2*   < > 10.3* 10.9* 10.5*  HCT 38.5   < > 37.4 32.0* 38.8  WBC 8.2  --  7.4  --  7.9  PLT 171  --  165  --  215   < > = values in this interval not displayed.    COAGULATION Recent Labs  Lab 04-10-2019 2015  INR 1.2    CARDIAC  No results for input(s): TROPONINI in the last 168 hours. No results for input(s): PROBNP in the last 168 hours.   CHEMISTRY Recent Labs  Lab 04/07/19 0500  04/07/19 1719 04/08/19 0500  04/08/19 1528 04/08/19 2222 04/09/19 0419 04/09/19 0812 04/10/19 0450  NA 142   < >  --  152*   < > 156* 155* 161* 168* 164*  K 3.7   < >  --  3.9   < > 3.6 3.5 3.4* 3.1* 3.3*  CL 110  --   --  118*  --   --  124* 128*  --  128*  CO2 24  --   --  28  --   --  26 26  --  31  GLUCOSE 415*  --   --  154*  --   --  120* 115*  --  88  BUN 21*  --   --  27*  --   --  31* 29*  --  25*  CREATININE 0.78  --   --  0.76  --   --  0.86 0.84  --  0.76  CALCIUM 6.5*  --   --  7.4*  --   --  7.3* 7.7*  --  7.4*  MG 1.8  --  2.3 2.5*  --   --  2.3 2.4  --  2.0  PHOS 1.1*  --  1.0* 1.7*  --   --   --  1.2*  --  2.8   < > = values in this interval not displayed.   Estimated Creatinine Clearance: 118.6 mL/min (by C-G formula based on SCr of 0.76 mg/dL).   LIVER Recent Labs  Lab 04-10-19 2015 04/06/19 0310 04/07/19 0500 04/08/19 0500 04/09/19 0419 04/10/19 0450  AST 92* 100* 66* 51* 51* 37  ALT 34 35 ALKPHOS 65 58 50 58 64 62  BILITOT 0.6 0.4 0.1* 0.7  0.3 0.6  PROT 7.0 6.8 5.4* 6.4* 6.3* 6.1*  ALBUMIN 2.5* 2.4* 1.8* 2.2* 2.2* 2.1*  INR 1.2  --   --   --   --   --      INFECTIOUS Recent Labs  Lab 04-10-19 2015 April 10, 2019 2250  04/07/19 0500 04/08/19 0500 04/09/19 0419  LATICACIDVEN 1.4 1.2  --   --   --   --   PROCALCITON  --   --    < > 1.23 0.68 0.50   < > = values in this interval not displayed.     ENDOCRINE CBG (last 3)  Recent Labs    04/09/19 2325 04/10/19 0407 04/10/19 0742  GLUCAP 84 76 90         IMAGING x48h  - image(s) personally visualized  -   highlighted in bold Dg Abd 1 View  Result Date: 04/10/2019 CLINICAL DATA:  Nasogastric tube placement EXAM: ABDOMEN - 1 VIEW COMPARISON:  Plain film of the abdomen dated 04-10-19. FINDINGS: Nasogastric tube appears adequately positioned in the stomach. Visualized bowel gas pattern is nonobstructive. IMPRESSION: Nasogastric tube appears adequately positioned in the stomach. Electronically Signed   By: Bary Richard M.D.   On: 04/10/2019 11:26   Dg Chest Port 1 View  Result Date: 04/08/2019 CLINICAL DATA:  Hypoxia. EXAM: PORTABLE CHEST 1 VIEW COMPARISON:  Radiograph same day. FINDINGS: Stable cardiomediastinal silhouette. Endotracheal and nasogastric tubes are unchanged in position. Left internal jugular catheter is  unchanged in position. No pneumothorax is noted. Right-sided PICC line is noted with distal tip in expected position of cavoatrial junction. Stable diffuse lung opacities are noted most consistent with pneumonia or edema. Small bilateral pleural effusions may be present. Bony thorax is unremarkable. IMPRESSION: Interval placement of right-sided PICC line with distal tip in expected position of cavoatrial junction. Otherwise stable support apparatus. Stable bilateral lung opacities are noted concerning for pneumonia or edema with possible small pleural effusions. Electronically Signed   By: Lupita Raider M.D.   On: 04/08/2019 15:22     Resolved Hospital  Problem list    UTI (Klebsiella OSH) - s/p cefepime Assessment & Plan:   #Baseline  -  pulmonary hypertension and atrial shunting,  #Current  - ARDS due to COVID 19 pneumonia:   - Mucus plugging left lower lobe 7/4  - Course complicated by  -- ?  Pulmonary embolism given COVID, acute on chronic pulmonary hypertension on echo  - MRSA pneumonia 04/06/2019  04/10/2019 - does not meet SBT criteria. On prone/supine cycles  PLAN - ARDS protocol; re-proned 04/09/2019 - - supine again 8h on 04/10/2019 - reassess facial edema and prone suitability towards end of 8h but intent should be to reprone till fio2 < 60% and peep < 10 - continue guiafenesin - continue hypertonic saline - Antivirals (remdesivir) and hydrocortisone to continue - No Tocilizumab due to bacterial infection and immunocompromised state - Rx dose lovenox  for empiric treatment of pulmonary embolism  Circulatory shock with bradycardia - off pressors 04/09/2019 in respnse to fluids  Electrolyte imbalance - hypernatreami  - ddvap and free water per triad - og tube reinsertion and increaed ddavp 04/10/2019  MRSA PNA 04/06/2019  -Vanc per triad   Immunocompromised As above  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  Best practice:  Diet: tube feeding Pain/Anxiety/Delirium protocol (if indicated): RASS -4 to -5 per intermittent vecuronium protocol VAP protocol (if indicated): yes DVT prophylaxis: yes GI prophylaxis: famotidine Glucose control: SSI Mobility: bed rest Code Status: DNR but continue full medical support Family Communication:   7/4 - I had multiple lengthy conversations with the patient's family today including her sister Massie Bougie as well as her daughters [name].  I explained that Ms. Kelso is  severely ill from COVID and requiring high levels of life support to stay alive.  I explained that I thought that her overall prognosis was poor but that I did not think it was unreasonable to continue to support her and hope for a recovery.  I did explain that in the event of cardiac arrest CPR, electric shocks and other interventions would be medically nonbeneficial and would cause more harm than good.  They voiced understanding and agreed to a DNR status in the event of a cardiac arrest.  We will continue full mechanical ventilatory support and other forms of medical support in the meantime. Disposition: remain in ICU   7/5 - called sister belinda listed number of [phone] x 2 - says ":call cannot be completed"  7/6 -same response as 04/09/2019      ATTESTATION & SIGNATURE   The patient Christine Patterson is critically ill with multiple organ systems failure and requires high complexity decision making for assessment and support, frequent evaluation and titration of therapies, application of advanced monitoring technologies and extensive  interpretation of multiple databases.   Critical Care Time devoted to patient care services described in this note is  30  Minutes. This time reflects time of care of this signee Dr Kalman ShanMurali Jody Aguinaga. This critical care time does not reflect procedure time, or teaching time or supervisory time of PA/NP/Med student/Med Resident etc but could involve care discussion time     Dr. Kalman ShanMurali Dekisha Mesmer, M.D., [facility]C.P Pulmonary and Critical Care Medicine Staff Physician [place] [facility] [place] [facility] Pager: [phone], If no answer or between  15:00h - 7:00h: call [phone]  04/10/2019 12:51 PM

## 2019-04-10 NOTE — Progress Notes (Signed)
 Nutrition Follow-up  DOCUMENTATION CODES:   Morbid obesity  INTERVENTION:   Tube Feeding should continue even when pt in prone position  Tube Feeding:  Change to Vital AF 1.2 at 50 ml/hr Pro-Stat 30 mL BID Provides 120 g of protein, 1640 kcals, 972 mL of free water Meets 100% estimated calorie and protein needs   NUTRITION DIAGNOSIS:   Inadequate oral intake related to acute illness as evidenced by NPO status.  Being addressed via TF   GOAL:   Provide needs based on ASPEN/SCCM guidelines  Met  MONITOR:   Vent status, Labs, Weight trends, TF tolerance, Skin  REASON FOR ASSESSMENT:   Consult, Ventilator Enteral/tube feeding initiation and management  ASSESSMENT:   48 yo female acute on chronic respiratory failure with coronavirus pneumonitis, ARDS requiring intubation, septic shock, AKI.  PMH includes CHF, GERD, sarcoidosis   7/01 Intubated 7/04 Emergent bronch, mucous plug  Pt remains on vent support, requiring prone positioning, on dopamine, fentanyl drip  Pt with loose teeth, tongue swollen with bite marks.  Vital  High Protein at 50 ml/hr, Pro-Stat 30 mL daily. Noted OG tube dislodged during the night and NG replaced with abdominal xray with tip in stomach  Severe hypernatremia due to DI; free water increased to 400 mL q 6 hours per MD, D5 at 100 ml/hr ordered  Current wt 148.2 kg; admission wt 132.2 kg. Net + 6 L per I.O flow sheet  Labs: sodium 164 (H), potassium 3.3, phosphorus wdl, CBGs 76-90 Meds: D5 at 100 ml/hr, Vitamin C, Zinc, ss novolog, solucortef   Diet Order:   Diet Order            Diet NPO time specified  Diet effective now              EDUCATION NEEDS:   Not appropriate for education at this time  Skin:  Skin Assessment: Skin Integrity Issues: Skin Integrity Issues:: Stage II Stage II: sacrum  Last BM:  7/6 rectal tube  Height:   Ht Readings from Last 1 Encounters:  04/23/2019 '5\' 2"'$  (1.575 m)    Weight:   Wt  Readings from Last 1 Encounters:  04/10/19 (!) 148.2 kg    Ideal Body Weight:  50 kg  BMI:  Body mass index is 59.77 kg/m.  Estimated Nutritional Needs:   Kcal:  1450-1850 kcals  Protein:  110-125 g  Fluid:  >/= 1.5 L    Nashanti Duquette MS, RDN, LDN, CNSC [phone] Pager  [phone] Weekend/On-Call Pager

## 2019-04-10 NOTE — Progress Notes (Signed)
Pt's head turned at this time to the left and tube repositioned.  Oral secretions copious and moderate secretions removed with suctioning.  A line was bleeding from site but was not fully out, waveforms still noted.  Aline was redressed and cleaned up.  RT will continue to monitor.

## 2019-04-10 NOTE — Progress Notes (Signed)
Tube feeding stopped at this time as patient had a large amount of liquid in the bed which appeared to be tube feeding. Noticed OG tube was further out of patient mouth from the tape marking on the OG. Will page TRH for update. Patient cleaned and linen changed

## 2019-04-10 NOTE — Progress Notes (Signed)
Pt unproned. See RT note. VS stable. The loose tooth finaly broke of. At the bedside. Other loose tooth is still iin. Tongue is swollen with bite marks. Eyes are swollen. New nG placed in, due to OG loss at the night shift.  New dark maroon color skin noted on the back, between fold. Pt is morbidly obese with a and folds that is thick. Possibly was done during proning/unproning positions. The stage 2 on the bottom which represented itself as a blisters at the admission, not are opened.  Pt is turned frequently, despite bad on rotation. Barrier cream applied to MASD, powder under the folds. Family called during unproning, cleaning.After pt is settles, sister, daughter and mother called. No one picked up.Another sister called. Was updated, explained the proving position, answered all the questions. Stated she will update the rest of the family

## 2019-04-10 NOTE — Progress Notes (Signed)
CJA7 and WPT0 at bedside to reposition patient head while proned.  ETT secured 22@ lip. Moderate amount of oral and tracheal secretions.

## 2019-04-10 NOTE — Progress Notes (Signed)
Pharmacy Brief Note  O: VT= 15 VP=56 SCr= 0.76  A/P: Unable to calculate AUC with vancomycin peak drawn while vancomycin still infusing. Trough is acceptable and renal function remains stable. Continue current dosing and consider rechecking levels depending on plans for antibiotics.  Ulice Dash, PharmD, BCPS Clinical Pharmacist 04/10/19 5:30 PM

## 2019-04-10 NOTE — Progress Notes (Signed)
Daughter Leandra Kern called and updated. Stated she has no questions at this time but would like an update in the AM. Also stated she would like to facetime tomorrow and she does not care what time, she is available all day.

## 2019-04-10 NOTE — Progress Notes (Signed)
Pt unproned at this time. Ett found at 27 and advanced back to 23 at the lip where it was previously charted. VS within normal limits. RT will continue to monitor

## 2019-04-10 NOTE — Progress Notes (Addendum)
   Re-eval at bedside   S: shortl after syupine got brady into 30s and then settled. Then hypotensive  Plan  flid  bolus  -vasopressin - levophed - cbc stat   Signfiicantly worse - might not survive tonight - daughter Christine Patterson updated by Dr Sloan Leiter and myself 2:24 PM 04/10/2019 - Grave prognosis explained. Daughter In tears   [name],[name] Sister [phone] Christine Patterson Daughter [phone] [name] Mother [phone] Another sister [phone]   SIGNATURE    Dr. Brand Males, M.D., F.C.C.P,  Pulmonary and Critical Care Medicine Staff Physician, [place] Director - Interstitial Lung Disease  Program  Pulmonary [place] at [place], [place], [address]  Pager: [phone], If no answer or between  15:00h - 7:00h: call [phone] Telephone: [phone]  2:04 PM 04/10/2019

## 2019-04-10 NOTE — Progress Notes (Signed)
Pt's head turned at this time to the left.  No complications during turning other than finding oral secretions have tremendously increased.  Pink tinged, clear secretions were soaking pt from top to bottom.  Team held pt while she was washed and dried and pt was suctioned orally and through ETT.   Rt will continue to monitor.

## 2019-04-10 NOTE — Progress Notes (Signed)
Messgage from RT - patient too unstable   Plan Cancel plans to prone  Prognosis grim    SIGNATURE    Dr. Brand Males, M.D., F.C.C.P,  Pulmonary and Critical Care Medicine Staff Physician, [place] Director - Interstitial Lung Disease  Program  Pulmonary [place] at [place], [place], [address]  Pager: [phone], If no answer or between  15:00h - 7:00h: call [phone] Telephone: [phone]  6:40 PM 04/10/2019

## 2019-04-10 NOTE — Progress Notes (Signed)
PROGRESS NOTE                                                                                                                                                                                                             Patient Demographics:    Christine Patterson, is a 48 y.o. female, DOB - 1971/06/12, ILN:[id number]  Outpatient Primary MD for the patient is System, Pcp Not In    LOS - 5  No chief complaint on file.      Brief Narrative: Patient is a 48 y.o. 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   Subjective:    Christine Patterson today remains intubated and sedated. Was proned earlier this morning.Has had issues with bradycardia, hypotension multiple times over the day today.   Assessment  & Plan :   Acute Hypoxic Resp Failure with ARDS due to Covid 19 Viral pneumonia and suspected superimposed MRSA pneumonia/aspiration pneumonia: Continue full ventilator support-PCCM following and directing ventilator care.  For COVID-19 pneumonia-remains on steroids-finished a course of Remdesivir on 7/5.  Not given Actemra as patient on infliximab infusion every 6 weeks as an outpatient, furthermore it appears that she has superimposed MRSA pneumonia as well.  Remains on IV vancomycin for MRSA pneumonia.  On 7/4-patient underwent emergent bedside bronchoscopy for desaturation-was found to have mucous plugging.    She has multiple comorbidities at baseline including pulmonary and  neurosarcoidosis-hence given severity of her COVID-19 pneumonia with ARDS-her prognosis is probably very poor.   COVID-19 Labs:  Recent Labs    04/08/19 0500 04/09/19 0419 04/10/19 0450  DDIMER 2.43* 2.92* 6.78*  FERRITIN 202 195 131  CRP 10.6* 6.1* 3.1*    No results found for: SARSCOV2NAA   COVID-19 Medications: 7/1>> Solu-Medrol 7/1>> 7/5 Remdesivir   Vent Settings: Vent Mode: PRVC FiO2 (%):  [40 %-70 %] 40 % Set Rate:  [35 bmp] 35 bmp Vt Set:  [400 mL] 400 mL PEEP:  [14 cmH20-18 cmH20] 14 cmH20 Plateau Pressure:  [26 cmH20-33 cmH20] 26  cmH20  ABG:    Component Value Date/Time   PHART 7.397 04/09/2019 0812   PCO2ART 48.3 (H) 04/09/2019 0812   PO2ART 82.0 (L) 04/09/2019 0812   HCO3 30.1 (H) 04/09/2019 0812   TCO2 32 04/09/2019 0812   ACIDBASEDEF 2.0 04/08/2019 1528   O2SAT 96.0 04/09/2019 0812    Septic shock secondary to MRSA pneumonia and COVID-19: Sepsis pathophysiology is overall better-was not requiring pressors this morning. However, has had multiple episodes of hypotension and bradycardia overnight. Has been restarerted on Levophed this afternoon, if remains brady will start Dopamine. Keep Atropine at bediside. Sputum cultures positive for MRSA-remains on vancomycin.  Has completed a course of Remdesivir on 7/5 for COVID-19.  Hypotension: Developed overnight on 7/5-probably secondary to hypovolemia due to significant amount of urine output-likely from DI. See above regarding intermittent episodes of hypotension and bradycardia  Sinus bradycardia: Probably a vagal response to being supine after being prone.  Restarted on dopamine briefly for severe bradycardia with heart rate in the 30s despite afternoon.  Heart rate slowly improving-we will add as needed atropine.  Acute metabolic and toxic encephalopathy: Unable to assess mental status as patient is on sedation.  She has neurosarcoidosis at baseline-once her sedation needs have decreased significantly-we can  then assess for any neurological improvement.  Given the fact that she had profound hypoxemia and agonal respiration when EMS arrived-she is obviously at risk of some anoxic injury.  CT head on 7/2 without any acute abnormalities.  EEG ordered but currently pending  Transaminitis: Secondary to COVID-19-mild and downtrending.  Follow periodically.  Hypernatremia: Secondary to DI-has known history of panhypopituitarism secondary to neurosarcoidosis.  Still with significant hyponatremia-slightly better than yesterday-urine output in the past 24 hours around 3 L.  Change DDAVP to 3 times daily dosing.  Have increased free water via tube, and have switched to D5W infusion.    Panhypopituitarism: Continue steroids-but suspect we can now taper slowly, continue levothyroxine and DDAVP.  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  DM-2: CBG stable with SSI  OSA on CPAP at home  Palliative care: critically ill-not improving-and at danger for further worsening and death. Dr Chase Caller and myself spoke with daughter over the phone. Explained that patient is currently receiving maximal medical treatment-with really no further role to escalate care. DNR in place. Unable to reach sister.   Condition - Extremely Guarded-as noted above-prognosis is poor at this point.  Family Communication  :  Spoke with daughter   Code Status : DNR  Diet :  Diet Order            Diet NPO time specified  Diet effective now               Disposition Plan  :  Remain inpatient  Consults  :  PCCM  Procedures  :   Bronchoscopy 7/4 ETT>> 7/1 L IJ CVL 7/1  GI prophylaxis: H2 Blocker  DVT Prophylaxis  :  Lovenox therapeutic dosing  Lab Results  Component Value Date   PLT 215 04/10/2019    Inpatient Medications  Scheduled Meds:  aspirin  81 mg Per Tube Daily   atorvastatin  40 mg Per Tube q1800   chlorhexidine gluconate  (MEDLINE KIT)  15 mL Mouth Rinse BID   Chlorhexidine Gluconate Cloth  6 each Topical Daily   desmopressin  2 mcg Subcutaneous TID   enoxaparin (LOVENOX) injection  140 mg Subcutaneous Q12H   famotidine  20 mg Per Tube BID  feeding supplement (PRO-STAT SUGAR FREE 64)  30 mL Per Tube Daily   free water  400 mL Per Tube Q6H   guaiFENesin  15 mL Oral Q6H   hydrocortisone sod succinate (SOLU-CORTEF) inj  50 mg Intravenous Q8H   insulin aspart  0-9 Units Subcutaneous Q4H   levothyroxine  125 mcg Per Tube Q0600   mouth rinse  15 mL Mouth Rinse 10 times per day   mupirocin ointment  1 application Nasal BID   potassium chloride  40 mEq Per Tube Once   sodium chloride flush  10-40 mL Intracatheter Q12H   sodium chloride HYPERTONIC  4 mL Nebulization BID   vitamin C  500 mg Per Tube Daily   zinc sulfate  220 mg Per Tube Daily   Continuous Infusions:  sodium chloride Stopped (04/09/19 0634)   dextrose 100 mL/hr at 04/10/19 1100   DOPamine 5 mcg/kg/min (04/10/19 1200)   feeding supplement (VITAL HIGH PROTEIN) 1,000 mL (04/10/19 1208)   fentaNYL infusion INTRAVENOUS 150 mcg/hr (04/10/19 1100)   midazolam 2 mg/hr (04/10/19 0600)   norepinephrine (LEVOPHED) Adult infusion Stopped (04/08/19 2144)   remdesivir 100 mg in NS 250 mL     vancomycin 2,000 mg (04/09/19 1514)   PRN Meds:.chlorpheniramine-HYDROcodone, fentaNYL, midazolam **OR** midazolam, sodium chloride flush, vecuronium  Antibiotics  :    Anti-infectives (From admission, onward)   Start     Dose/Rate Route Frequency Ordered Stop   04/10/19 2100  remdesivir 100 mg in sodium chloride 0.9 % 250 mL IVPB     100 mg 500 mL/hr over 30 Minutes Intravenous Every 24 hours 04/10/19 1118 04/15/19 2059   04/07/19 1200  vancomycin (VANCOCIN) 2,000 mg in sodium chloride 0.9 % 500 mL IVPB     2,000 mg 250 mL/hr over 120 Minutes Intravenous Every 24 hours 04/07/19 1126     04/06/19 2200  remdesivir 100 mg in sodium  chloride 0.9 % 250 mL IVPB     100 mg 500 mL/hr over 30 Minutes Intravenous Every 24 hours 04/10/2019 2206 04/09/19 2146   04/06/19 0800  vancomycin (VANCOCIN) 1,250 mg in sodium chloride 0.9 % 250 mL IVPB  Status:  Discontinued     1,250 mg 166.7 mL/hr over 90 Minutes Intravenous Every 24 hours 04/30/2019 2225 04/07/19 1126   04/06/19 0600  ceFEPIme (MAXIPIME) 2 g in sodium chloride 0.9 % 100 mL IVPB  Status:  Discontinued     2 g 200 mL/hr over 30 Minutes Intravenous Every 8 hours 04/29/2019 2225 04/09/19 1413   04/19/2019 2300  remdesivir 200 mg in sodium chloride 0.9 % 250 mL IVPB     200 mg 500 mL/hr over 30 Minutes Intravenous Once 04/19/2019 2206 04/26/2019 2334   04/16/2019 2030  vancomycin (VANCOCIN) 2,500 mg in sodium chloride 0.9 % 500 mL IVPB  Status:  Discontinued     2,500 mg 250 mL/hr over 120 Minutes Intravenous  Once 05/03/2019 1926 04/27/2019 2116   04/07/2019 2000  ceFEPIme (MAXIPIME) 2 g in sodium chloride 0.9 % 100 mL IVPB     2 g 200 mL/hr over 30 Minutes Intravenous  Once 05/03/2019 1926 04/22/2019 2112    2000  metroNIDAZOLE (FLAGYL) IVPB 500 mg  Status:  Discontinued     500 mg 100 mL/hr over 60 Minutes Intravenous Every 8 hours 04/12/2019 1926 04/08/19 1127       Time Spent in minutes  45   The patient is critically ill with multiple organ system failure and requires high  complexity decision making for assessment and support, frequent evaluation and titration of therapies, advanced monitoring, review of radiographic studies and interpretation of complex data.    Oren Binet M.D on 04/10/2019 at 1:04 PM  To page go to [email or web address] - use universal password  [email or web address] -  Office  [phone]  See all Orders from today for further details   Admit date - 04/19/2019    5    Objective:   Vitals:   04/10/19 0600 04/10/19 0733 04/10/19 0855 04/10/19 1200  BP:  136/87  (!) 133/98  Pulse: (!) 103 68  60  Resp: (!) 35 (!) 35  (!) 35  Temp: 97.7 F (36.5 C) 98.5  F (36.9 C)    TempSrc:  Oral    SpO2: 100% 100% 100% 95%  Weight:      Height:        Wt Readings from Last 3 Encounters:  04/10/19 (!) 148.2 kg     Intake/Output Summary (Last 24 hours) at 04/10/2019 1304 Last data filed at 04/10/2019 1211 Gross per 24 hour  Intake 6369.56 ml  Output 2475 ml  Net 3894.56 ml     Physical Exam General appearance: Intubated sedated. Left pupil not responding Eyes:no scleral icterus. HEENT: Atraumatic and Normocephalic Neck: supple, no JVD. Resp:Good air entry bilaterally,no r added sounds heard anteriorly CVS: S1 S2 regular, no murmurs.  GI: Bowel sounds present, Non tender and not distended with no gaurding, rigidity or rebound. Extremities: B/L Lower Ext shows no edema, both legs are warm to touch    Data Review:    CBC Recent Labs  Lab 04/06/19 0310  04/07/19 0500  04/08/19 0500 04/08/19 1321 04/08/19 1528 04/09/19 0419 04/09/19 0812 04/10/19 0450  WBC 14.7*  --  9.6  --  8.2  --   --  7.4  --  7.9  HGB 11.4*   < > 9.5*   < > 10.2* 12.9 11.2* 10.3* 10.9* 10.5*  HCT 42.5   < > 35.9*   < > 38.5 38.0 33.0* 37.4 32.0* 38.8  PLT 192  --  152  --  171  --   --  165  --  215  MCV 94.7  --  95.7  --  94.6  --   --  93.7  --  94.4  MCH 25.4*  --  25.3*  --  25.1*  --   --  25.8*  --  25.5*  MCHC 26.8*  --  26.5*  --  26.5*  --   --  27.5*  --  27.1*  RDW 17.3*  --  17.7*  --  17.8*  --   --  18.1*  --  18.0*  LYMPHSABS 0.8  --  0.8  --  0.9  --   --  1.0  --  1.5  MONOABS 1.1*  --  0.5  --  0.5  --   --  0.5  --  0.9  EOSABS 0.0  --  0.0  --  0.0  --   --  0.1  --  0.0  BASOSABS 0.0  --  0.0  --  0.0  --   --  0.1  --  0.0   < > = values in this interval not displayed.    Chemistries  Recent Labs  Lab 04/06/19 0310  04/07/19 0500  04/07/19 1719 04/08/19 0500  04/08/19 1528 04/08/19 2222 04/09/19 0419 04/09/19 0812 04/10/19 0450  NA 154*   < >  142   < >  --  152*   < > 156* 155* 161* 168* 164*  K 4.5   < > 3.7   < >  --   3.9   < > 3.6 3.5 3.4* 3.1* 3.3*  CL 116*  --  110  --   --  118*  --   --  124* 128*  --  128*  CO2 30  --  24  --   --  28  --   --  26 26  --  31  GLUCOSE 127*  --  415*  --   --  154*  --   --  120* 115*  --  88  BUN 20  --  21*  --   --  27*  --   --  31* 29*  --  25*  CREATININE 1.20*  --  0.78  --   --  0.76  --   --  0.86 0.84  --  0.76  CALCIUM 7.6*  --  6.5*  --   --  7.4*  --   --  7.3* 7.7*  --  7.4*  MG  --    < > 1.8  --  2.3 2.5*  --   --  2.3 2.4  --  2.0  AST 100*  --  66*  --   --  51*  --   --   --  51*  --  37  ALT 35  --  28  --   --  29  --   --   --  31  --  27  ALKPHOS 58  --  50  --   --  58  --   --   --  64  --  62  BILITOT 0.4  --  0.1*  --   --  0.7  --   --   --  0.3  --  0.6   < > = values in this interval not displayed.   ------------------------------------------------------------------------------------------------------------------ No results for input(s): CHOL, HDL, LDLCALC, TRIG, CHOLHDL, LDLDIRECT in the last 72 hours.  No results found for: HGBA1C ------------------------------------------------------------------------------------------------------------------ Recent Labs    04/08/19 2222  TSH <0.010*   ------------------------------------------------------------------------------------------------------------------ Recent Labs    04/09/19 0419 04/10/19 0450  FERRITIN 195 131    Coagulation profile Recent Labs  Lab 04/07/2019 2015  INR 1.2    Recent Labs    04/09/19 0419 04/10/19 0450  DDIMER 2.92* 6.78*    Cardiac Enzymes No results for input(s): CKMB, TROPONINI, MYOGLOBIN in the last 168 hours.  Invalid input(s): CK ------------------------------------------------------------------------------------------------------------------ No results found for: BNP  Micro Results Recent Results (from the past 240 hour(s))  MRSA PCR Screening     Status: Abnormal   Collection Time: 04/06/19  2:45 AM   Specimen: Nasopharyngeal    Result Value Ref Range Status   MRSA by PCR POSITIVE (A) NEGATIVE Final    Comment:        The GeneXpert MRSA Assay (FDA approved for NASAL specimens only), is one component of a comprehensive MRSA colonization surveillance program. It is not intended to diagnose MRSA infection nor to guide or monitor treatment for MRSA infections. RESULT CALLED TO, READ BACK BY AND VERIFIED WITH: Arta Silence [id number] @ 7510 BY J SCOTTON Performed at [facility] [address], [place], [place] [address]   Culture, respiratory (non-expectorated)     Status: None   Collection Time:  04/06/19 11:36 AM   Specimen: Tracheal Aspirate; Respiratory  Result Value Ref Range Status   Specimen Description   Final    TRACHEAL ASPIRATE Performed at [facility] [address], [place], [place] [address]    Special Requests   Final    NONE Performed at [facility], [place] [address] [place], [place] [address]    Gram Stain   Final    ABUNDANT WBC PRESENT,BOTH PMN AND MONONUCLEAR NO ORGANISMS SEEN Performed at [facility], [place] [address] [place], [place] [address]    Culture RARE METHICILLIN RESISTANT STAPHYLOCOCCUS AUREUS  Final   Report Status 04/09/2019 FINAL  Final   Organism ID, Bacteria METHICILLIN RESISTANT STAPHYLOCOCCUS AUREUS  Final      Susceptibility   Methicillin resistant staphylococcus aureus - MIC*    CIPROFLOXACIN >=8 RESISTANT Resistant     ERYTHROMYCIN >=8 RESISTANT Resistant     GENTAMICIN <=0.5 SENSITIVE Sensitive     OXACILLIN >=4 RESISTANT Resistant     TETRACYCLINE <=1 SENSITIVE Sensitive     VANCOMYCIN 1 SENSITIVE Sensitive     TRIMETH/SULFA <=10 SENSITIVE Sensitive     CLINDAMYCIN <=0.25 SENSITIVE Sensitive     RIFAMPIN <=0.5 SENSITIVE Sensitive     Inducible Clindamycin NEGATIVE Sensitive     * RARE METHICILLIN RESISTANT STAPHYLOCOCCUS AUREUS    Radiology Reports Dg Abd 1 View  Result Date:  04/10/2019 CLINICAL DATA:  Nasogastric tube placement EXAM: ABDOMEN - 1 VIEW COMPARISON:  Plain film of the abdomen dated 04/26/2019. FINDINGS: Nasogastric tube appears adequately positioned in the stomach. Visualized bowel gas pattern is nonobstructive. IMPRESSION: Nasogastric tube appears adequately positioned in the stomach. Electronically Signed   By: Franki Cabot M.D.   On: 04/10/2019 11:26   Ct Head Wo Contrast  Result Date: 04/06/2019 CLINICAL DATA:  48 y/o F; unexplained altered level of consciousness. Found unresponsive. Sepsis and COVID-19 positive. EXAM: CT HEAD WITHOUT CONTRAST TECHNIQUE: Contiguous axial images were obtained from the base of the skull through the vertex without intravenous contrast. COMPARISON:  None. FINDINGS: Brain: No evidence of acute infarction, hemorrhage, hydrocephalus, extra-axial collection or mass effect. Coarse calcifications are present which appear to be centered in the region of hypothalamus and mamillary bodies. Vascular: No hyperdense vessel or unexpected calcification. Skull: Normal. Negative for fracture or focal lesion. Sinuses/Orbits: Right maxillary sinus mucosal thickening. Large left maxillary sinus mucous retention cyst or polyp. Additional included paranasal sinuses and the mastoid air cells are normally aerated. Nasoenteric tube and endotracheal tube noted. Orbits are unremarkable. Other: None. IMPRESSION: 1. No acute intracranial abnormality identified. 2. Coarse calcifications which appears centered in the region of hypothalamus and mamillary bodies. Findings may represent a small underlying mass such as craniopharyngioma, glioma, and hamartoma or possibly sequelae of prior toxic/inflammatory process. Consider MRI of the brain without and with contrast to better characterize. Electronically Signed   By: Kristine Garbe M.D.   On: 04/06/2019 02:24   Dg Chest Port 1 View  Result Date: 04/08/2019 CLINICAL DATA:  Hypoxia. EXAM: PORTABLE CHEST 1  VIEW COMPARISON:  Radiograph same day. FINDINGS: Stable cardiomediastinal silhouette. Endotracheal and nasogastric tubes are unchanged in position. Left internal jugular catheter is unchanged in position. No pneumothorax is noted. Right-sided PICC line is noted with distal tip in expected position of cavoatrial junction. Stable diffuse lung opacities are noted most consistent with pneumonia or edema. Small bilateral pleural effusions may be present. Bony thorax is unremarkable. IMPRESSION: Interval placement of right-sided PICC  line with distal tip in expected position of cavoatrial junction. Otherwise stable support apparatus. Stable bilateral lung opacities are noted concerning for pneumonia or edema with possible small pleural effusions. Electronically Signed   By: Marijo Conception M.D.   On: 04/08/2019 15:22   Dg Chest Port 1 View  Result Date: 04/08/2019 CLINICAL DATA:  Worsening oxygenation. EXAM: PORTABLE CHEST 1 VIEW COMPARISON:  Chest radiograph 04/06/2019 FINDINGS: ET tube mid trachea. Left IJ central venous catheter tip projects over the right atrium. Enteric tube side port terminates in the mid esophagus. Stable cardiomegaly. Monitoring leads overlie the patient. Similar-appearing diffuse bilateral airspace opacities. No large pleural effusion or pneumothorax. Thoracic spine degenerative changes. IMPRESSION: ET tube side-port projects in the midesophagus, recommend advancement. Otherwise similar-appearing bilateral diffuse airspace opacities. Electronically Signed   By: Lovey Newcomer M.D.   On: 04/08/2019 11:41   Dg Chest Port 1 View  Result Date: 04/06/2019 CLINICAL DATA:  Shortness of breath. EXAM: PORTABLE CHEST 1 VIEW COMPARISON:  04/06/2019. FINDINGS: Endotracheal tube, NG tube, left IJ line stable position. Stable cardiomegaly. Diffuse severe bilateral pulmonary infiltrates/edema again noted. No prominent pleural effusion. No pneumothorax. IMPRESSION: 1.  Lines and tubes stable position. 2.  Stable cardiomegaly. Diffuse severe bilateral pulmonary infiltrates/edema again noted. No interim change. Electronically Signed   By: Marcello Moores  Register   On: 04/06/2019 08:42   Dg Chest Port 1 View  Result Date: 04/06/2019 CLINICAL DATA:  Respiratory failure.  COVID-19 positive. EXAM: PORTABLE CHEST 1 VIEW COMPARISON:  No recent prior. FINDINGS: Left IJ line noted with tip over cavoatrial junction. NG tube noted with tip below left hemidiaphragm. Cardiomegaly. Diffuse bilateral pulmonary infiltrates/edema. Tiny left effusion cannot be excluded. No pneumothorax. IMPRESSION: 1. Left IJ line noted with tip over cavoatrial junction. NG tube noted with tip below left hemidiaphragm. 2.  Cardiomegaly. 3. Severe diffuse bilateral pulmonary infiltrates/edema. Tiny left pleural effusion cannot be excluded. Electronically Signed   By: Marcello Moores  Register   On: 04/06/2019 07:36   Dg Abd Portable 1v  Result Date: 05/01/2019 CLINICAL DATA:  Check gastric catheter placement EXAM: PORTABLE ABDOMEN - 1 VIEW COMPARISON:  None. FINDINGS: Scattered large and small bowel gas is noted. Gastric catheter is noted within the distal aspect of the stomach. Patchy infiltrates are noted within the lungs bilaterally. IMPRESSION: Gastric catheter within the stomach. Electronically Signed   By: Inez Catalina M.D.   On: 04/26/2019 21:55   [place] Ekg Site Rite  Result Date: 04/07/2019 If Site Rite image not attached, placement could not be confirmed due to current cardiac rhythm.

## 2019-04-10 NOTE — Progress Notes (Signed)
Pt sats in 86, placed on 100. HR decrease to 40s, even 37. MD notified and aware.Levo started. Atropine at the bedside for PRN less than 30.

## 2019-04-11 ENCOUNTER — Inpatient Hospital Stay (HOSPITAL_COMMUNITY): Payer: Medicaid Other

## 2019-04-11 DIAGNOSIS — J80 Acute respiratory distress syndrome: Secondary | ICD-10-CM

## 2019-04-11 LAB — COMPREHENSIVE METABOLIC PANEL
ALT: 26 U/L (ref 0–44)
AST: 28 U/L (ref 15–41)
Albumin: 1.9 g/dL — ABNORMAL LOW (ref 3.5–5.0)
Alkaline Phosphatase: 55 U/L (ref 38–126)
Anion gap: 9 (ref 5–15)
BUN: 23 mg/dL — ABNORMAL HIGH (ref 6–20)
CO2: 27 mmol/L (ref 22–32)
Calcium: 7.3 mg/dL — ABNORMAL LOW (ref 8.9–10.3)
Chloride: 121 mmol/L — ABNORMAL HIGH (ref 98–111)
Creatinine, Ser: 0.66 mg/dL (ref 0.44–1.00)
GFR calc Af Amer: >60 mL/min (ref >60)
GFR calc non Af Amer: >60 mL/min (ref >60)
Glucose, Bld: 150 mg/dL — ABNORMAL HIGH (ref 70–99)
Potassium: 3.6 mmol/L (ref 3.5–5.1)
Sodium: 157 mmol/L — ABNORMAL HIGH (ref 135–145)
Total Bilirubin: 0.3 mg/dL (ref 0.3–1.2)
Total Protein: 5.3 g/dL — ABNORMAL LOW (ref 6.5–8.1)

## 2019-04-11 LAB — GLUCOSE, CAPILLARY
Glucose-Capillary: 101 mg/dL — ABNORMAL HIGH (ref 70–99)
Glucose-Capillary: 103 mg/dL — ABNORMAL HIGH (ref 70–99)
Glucose-Capillary: 127 mg/dL — ABNORMAL HIGH (ref 70–99)
Glucose-Capillary: 131 mg/dL — ABNORMAL HIGH (ref 70–99)
Glucose-Capillary: 132 mg/dL — ABNORMAL HIGH (ref 70–99)
Glucose-Capillary: 136 mg/dL — ABNORMAL HIGH (ref 70–99)

## 2019-04-11 LAB — CBC
HCT: 37.7 % (ref 36.0–46.0)
Hemoglobin: 10.4 g/dL — ABNORMAL LOW (ref 12.0–15.0)
MCH: 26.1 pg (ref 26.0–34.0)
MCHC: 27.6 g/dL — ABNORMAL LOW (ref 30.0–36.0)
MCV: 94.7 fL (ref 80.0–100.0)
Platelets: 217 10*3/uL (ref 150–400)
RBC: 3.98 MIL/uL (ref 3.87–5.11)
RDW: 18.2 % — ABNORMAL HIGH (ref 11.5–15.5)
WBC: 8.2 10*3/uL (ref 4.0–10.5)
nRBC: 2.5 % — ABNORMAL HIGH (ref 0.0–0.2)

## 2019-04-11 LAB — FERRITIN: Ferritin: 120 ng/mL (ref 11–307)

## 2019-04-11 LAB — C-REACTIVE PROTEIN: CRP: 2.4 mg/dL — ABNORMAL HIGH (ref <1.0)

## 2019-04-11 LAB — D-DIMER, QUANTITATIVE: D-Dimer, Quant: 3.55 ug/mL-FEU — ABNORMAL HIGH (ref 0.00–0.50)

## 2019-04-11 LAB — CREATININE, SERUM
Creatinine, Ser: 0.74 mg/dL (ref 0.44–1.00)
GFR calc Af Amer: >60 mL/min (ref >60)
GFR calc non Af Amer: >60 mL/min (ref >60)

## 2019-04-11 LAB — BRAIN NATRIURETIC PEPTIDE: B Natriuretic Peptide: 376.5 pg/mL — ABNORMAL HIGH (ref 0.0–100.0)

## 2019-04-11 MED ORDER — SODIUM CHLORIDE 0.45 % IV SOLN
INTRAVENOUS | Status: DC
  Administered 2019-04-11: 15:00:00 via INTRAVENOUS

## 2019-04-11 NOTE — Progress Notes (Signed)
NAME:  Christine GemmaJennifer Quebedeaux, MRN:  [id number], DOB:  [birth year], LOS: 6 ADMISSION DATE:  04/11/2019, CONSULTATION DATE:  7/2 REFERRING MD:  Dr. Thedore MinsSingh, CHIEF COMPLAINT:  dyspnea  Brief History   48 y/o female with a complex medical history admitted for ARDS from COVID pneumonia.  There was also concern for aspiration pneumonia on admission.    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 Prone overnight July 4 > mucus plug after moving to supine position  Severe acute hypoxemia again later  Emergent bronchoscopy  July 6 -  7/5  - remains unresponsive. Was bradycardic and switched levophed to dopamine. MAP now 68 with sbp 98.  On fent gtt125 and versed 2mg . Significant DI with high urinary volume and hypernatremia. Pressor need felt due to be volume loss -> DDAVP changed from oral to subcut -> Urine Op improved. On 70% fio2, 18 peep -> Po2 82. Currently remains on vanc and cefepime; both day 5. On Rx dose lovenox  Consults:  PCCM  Procedures:  7/1 ETT >  7/2 L IJ CVL >  7/4 Emergent bronchoscopy: mucus plugging left lower lobe, emergent bedside echo:  normal LV contractility, no pericardial fluid, dilated, hypocontractile RV  Significant Diagnostic Tests:  12/2018 HRCT  showed findings worrisome for pulmonary parenchymal disease from sarcoidosis, likely pulmonary hypertension 11/2017 TTE> moderate LVH, LVEF normal, RV enlarted, hypocontractile free wall, mild global decrease, RVSP 38 mmHg; atrial shunting noted  Micro Data:  6/30 SARS-COV-2> positive 6/30 blood culture OSH > 1/4 coag neg staph 7/1 urine culture > klebsiella 7/2 resp culture  - MRSA  Antimicrobials:  7/1 cefepime>  7/1 flagyl > 7/4 7/1 remdesivir >  7/1vancomycin >  7/1 hydrocortisone >   Interim history/subjective:    7/7 - 70%, peep 14 fio2. Not on pessors. Making urine  ./ Finally got hold of sister Kennieth FrancoisBelinda Sanders - [phone] (epic number was wrong) -> reports that terminal care is appropriate for patient. However, says daughter is very emotional and not coming to terms. Asking for HCW help in getting daughter on board.  COVID-19 from birthday party outbreak  RT /RN feel patient too unstal to prone -HR 30s occassionally . Ur op and Na improved after IV DDAVP. On  Objective   Blood pressure 117/64, pulse (!) 49, temperature 97.6 F (36.4 C), temperature source Axillary, resp. rate (!) 34, height 5\' 2"  (1.575 m), weight (!) 145.8 kg, SpO2 100 %.    Vent Mode: PRVC FiO2 (%):  [70 %-100 %] 70 % Set Rate:  [35 bmp] 35 bmp Vt Set:  [400 mL] 400 mL PEEP:  [14 cmH20] 14 cmH20 Plateau Pressure:  [25 cmH20-29 cmH20] 25 cmH20   Intake/Output Summary (Last 24 hours) at 04/11/2019 1204 Last data filed at 04/11/2019 0900 Gross per 24 hour  Intake 5454.82 ml  Output 1400 ml  Net 4054.82 ml   Filed Weights   04/08/19 0500 04/10/19  0500 04/11/19 0500  Weight: (!) 141.4 kg (!) 148.2 kg (!) 145.8 kg      General Appearance:  Looks criticall ill OBESE - +  Head:  Normocephalic, without obvious abnormality, atraumatic Eyes:  PERRL - yes, conjunctiva/corneas - muddy     Ears:  Normal external ear canals, both ears Nose:  G tube - no Throat:  ETT TUBE - yes , OG tube - yes Neck:   Supple,  No enlargement/tenderness/nodules Lungs: Clear to auscultation bilaterally, Ventilator   Synchrony - yes Heart:  S1 and S2 normal, no murmur, CVP - no.  Pressors - off Abdomen:  Soft, no masses, no organomegaly Genitalia / Rectal:  Not done Extremities:  Extremities- intact Skin:  ntact in exposed areas . Sacral area - not examiend Neurologic:  Sedation - fen gtt , versedg tt -> RASS - -3    Resolved Hospital Problem list   Aspiration pneumonia. Klebesiella UTI = Rx cefepime, 5 day cours    Assessment & Plan:  #baseline -  pulmonary hypertension and atrial shunting   ARDS due to COVID 19 pneumonia: Course complicated by  - Mucus plugging left lower lobe 7/4  -  - , acute decompensated cor-pulmonal from severe acute respiratory failure  = ? Pulmonary embolism given COVID, acute on chronic pulmonary hypertension   04/11/2019 - > does not meet criteria for SBT/Extubation in setting of Acute Respiratory Failure due to ARDS , 70% fio2/peep 14. 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  Best practice:  Diet: tube feeding Pain/Anxiety/Delirium protocol (if indicated): RASS -4 to -5 per intermittent vecuronium protocol VAP protocol  (if indicated): yes DVT prophylaxis: yes GI prophylaxis: famotidine Glucose control: SSI Mobility: bed rest Code Status: DNR but continue full medical support Family Communication:   7/4 - I had multiple lengthy conversations with the patient's family today including her sister Marliss Coots as well as her daughters [name].  I explained that Ms. Dingwall is severely ill from [place] and requiring high levels of life support to stay alive.  I explained that I thought that her overall prognosis was poor but that I did not think it was unreasonable to continue to support her and hope for a recovery.  I did explain that in the event of cardiac arrest CPR, electric shocks and other interventions would be medically nonbeneficial and would cause more harm than good.  They voiced understanding and agreed to a DNR status in the event of a cardiac arrest.  We will continue full mechanical ventilatory support and other forms of medical support in the meantime. Disposition: remain in ICU   7/5 - called sister belinda listed number of [phone] x [phone] - says ":call cannot be completed"  7/6 - daughter updated- very emotiona  7/7 - sister Jola Baptist in agreement with terminal  wean. REquests that children (adults) be brought on board. Informed her that circulatory shock, worsening ARDS, and renal failures are indicators of terminal decline       ATTESTATION & SIGNATURE   The patient Christine Patterson is critically ill with multiple organ systems failure and requires high complexity decision making for assessment and support, frequent evaluation and titration of therapies, application of advanced monitoring technologies and extensive interpretation of multiple databases.   Critical Care Time devoted to patient care services described in this note is  30  Minutes. This time reflects time of care of this signee Dr Kalman ShanMurali Mariavictoria Nottingham. This critical care time does not reflect procedure time, or teaching time or  supervisory time of PA/NP/Med student/Med Resident etc but could involve care discussion time     Dr. Kalman ShanMurali Eyad Rochford, M.D., [facility] Pulmonary and Critical Care Medicine Staff Physician [facility] [place] [facility] Pager: [phone], If no answer or between  15:00h - 7:00h: call [phone]  04/11/2019 12:04 PM    LABS    PULMONARY Recent Labs  Lab 04/07/19 1622 04/08/19 1321 04/08/19 1528 04/09/19 0812 04/10/19 1813  PHART 7.342* 7.131* 7.260* 7.397 7.354  PCO2ART 48.9* 69.4* 57.1* 48.3* 54.8*  PO2ART 63.0* 32.0* 71.0* 82.0* 72.0*  HCO3 26.7 23.4 25.6 30.1* 30.6*  TCO2 28 26 27  32 32  O2SAT 91.0 45.0 91.0 96.0 93.0    CBC Recent Labs  Lab 04/10/19 0450 04/10/19 1450 04/10/19 1813 04/11/19 0753  HGB 10.5* 10.0* 11.2* 10.4*  HCT 38.8 36.1 33.0* 37.7  WBC 7.9 10.3  --  8.2  PLT 215 230  --  217    COAGULATION Recent Labs  Lab 04/30/2019 2015  INR 1.2    CARDIAC  No results for input(s): TROPONINI in the last 168 hours. No results for input(s): PROBNP in the last 168 hours.   CHEMISTRY Recent Labs  Lab 04/07/19 0500  04/07/19 1719 04/08/19 0500  04/08/19 2222 04/09/19 0419 04/09/19 [id number] 04/10/19 0450 04/10/19 1813 04/11/19 0516 04/11/19 0753  NA 142   < >  --  152*   < > 155* 161* 168* 164* 160*  --  157*  K 3.7   < >  --  3.9   < > 3.5 3.4* 3.1* 3.3* 3.7  --  3.6  CL 110  --   --  118*  --  124* 128*  --  128*  --   --  121*  CO2 24  --   --  28  --  26 26  --  31  --   --  27  GLUCOSE 415*  --   --  154*  --  120* 115*  --  88  --   --  150*  BUN 21*  --   --  27*  --  31* 29*  --  25*  --   --  23*  CREATININE 0.78  --   --  0.76  --  0.86 0.84  --  0.76  --  0.74 0.66  CALCIUM 6.5*  --   --  7.4*  --  7.3* 7.7*  --  7.4*  --   --  7.3*  MG 1.8  --  2.3 2.5*  --  2.3 2.4  --  2.0  --   --   --   PHOS 1.1*  --  1.0* 1.7*  --   --  1.2*  --  2.8  --   --   --    < > = values in this interval not displayed.    Estimated Creatinine Clearance: 117.4 mL/min (by C-G formula based on SCr of 0.66 mg/dL).   LIVER Recent Labs  Lab 2019-03-14 2015  04/07/19 0500 04/08/19 0500 04/09/19 0419 04/10/19 0450 04/11/19 0753  AST 92*   < > 66* 51* 51* 37 28  ALT 34   < > 28 29 31 27 26   ALKPHOS 65   < > 50 58 64 62 55  BILITOT 0.6   < > 0.1* 0.7 0.3 0.6 0.3  PROT 7.0   < > 5.4* 6.4* 6.3* 6.1* 5.3*  ALBUMIN 2.5*   < > 1.8* 2.2* 2.2* 2.1* 1.9*  INR 1.2  --   --   --   --   --   --    < > = values in this interval not displayed.     INFECTIOUS Recent Labs  Lab 2019-03-14 2015 2019-03-14 2250  04/07/19 0500 04/08/19 0500 04/09/19 0419  LATICACIDVEN 1.4 1.2  --   --   --   --   PROCALCITON  --   --    < > 1.23 0.68 0.50   < > = values in this interval not displayed.     ENDOCRINE CBG (last 3)  Recent Labs    04/10/19 2343 04/11/19 0516 04/11/19 0807  GLUCAP 151* 136* 132*         IMAGING x48h  - image(s) personally visualized  -   highlighted in bold Dg Abd 1 View  Result Date: 04/10/2019 CLINICAL DATA:  Nasogastric tube placement EXAM: ABDOMEN - 1 VIEW COMPARISON:  Plain film of the abdomen dated 2020-03-919. FINDINGS: Nasogastric tube appears adequately positioned in the stomach. Visualized bowel gas pattern is nonobstructive. IMPRESSION: Nasogastric tube appears adequately positioned in the stomach. Electronically Signed   By: Bary RichardStan  Maynard M.D.   On: 04/10/2019 11:26   Dg Chest Port 1 View  Result Date: 04/11/2019 CLINICAL DATA:  Respiratory failure.  Hypoxia. EXAM: PORTABLE CHEST 1 VIEW COMPARISON:  04/10/2019. FINDINGS: Endotracheal tube, NG tube, right PICC line stable position. Stable cardiomegaly. Diffuse bilateral pulmonary infiltrates/edema again noted. Low lung volumes with progressive bibasilar atelectasis/consolidation. Left pleural effusion cannot be excluded. No pneumothorax. IMPRESSION: 1.  Lines and tubes in stable position. 2.  Stable cardiomegaly. 3. Diffuse bilateral  pulmonary infiltrates/edema again noted. Low lung volumes with progressive bibasilar atelectasis/consolidation. Left pleural effusion cannot be excluded. Electronically Signed   By: Maisie Fushomas  Register   On: 04/11/2019 06:47   Dg Chest Port 1 View  Result Date: 04/10/2019 CLINICAL DATA:  ARDS. EXAM: PORTABLE CHEST 1 VIEW COMPARISON:  04/08/2019. FINDINGS: Interval removal of left IJ line. Endotracheal tube, NG tube, right PICC line stable position. Cardiomegaly. Diffuse bilateral pulmonary infiltrates/edema again noted. No prominent pleural effusion or pneumothorax. IMPRESSION: 1. Interval removal of left IJ line, remaining lines and tubes in stable position. No pneumothorax. 2. Stable cardiomegaly. Stable diffuse bilateral pulmonary interstitial infiltrates/edema again noted. Electronically Signed   By: Maisie Fushomas  Register   On: 04/10/2019 13:59

## 2019-04-11 NOTE — Progress Notes (Addendum)
PROGRESS NOTE                                                                                                                                                                                                             Patient Demographics:    Caragh Gasper, is a 48 y.o. female, DOB - [birth year], FRE:[id number]  Outpatient Primary MD for the patient is System, Pcp Not In    LOS - 6  No chief complaint on file.      Brief Narrative: Patient is a 48 y.o. 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   Subjective:    Whittley Carandang today remains intubated and sedated. Was proned earlier this morning.Has had issues with bradycardia, hypotension multiple times over the day today.   Assessment  & Plan :   Acute Hypoxic Resp Failure with ARDS due to Covid 19 Viral pneumonia and suspected superimposed MRSA pneumonia/aspiration pneumonia: Continue full ventilator support-PCCM following and directing ventilator care.  Has completed a course of Remdesivir for COVID-19, remains on steroids.  On IV vancomycin for MRSA pneumonia.   On 7/4-patient underwent emergent bedside bronchoscopy for desaturation-was found to have mucous plugging.      COVID-19 Labs:  Recent Labs    04/09/19 0419 04/10/19 0450 04/11/19 0753  DDIMER 2.92* 6.78* 3.55*  FERRITIN 195 131 120  CRP 6.1* 3.1* 2.4*    No results found for: SARSCOV2NAA   COVID-19 Medications: 7/1>> Solu-Medrol 7/1>> 7/5  Remdesivir   Vent Settings: Vent Mode: PRVC FiO2 (%):  [70 %-100 %] 70 % Set Rate:  [35 bmp] 35 bmp Vt Set:  [400 mL] 400 mL PEEP:  [14 cmH20] 14 cmH20 Plateau Pressure:  [25 cmH20-29 cmH20] 25 cmH20  ABG:    Component Value Date/Time   PHART 7.354 04/10/2019 1813   PCO2ART 54.8 (H) 04/10/2019 1813   PO2ART 72.0 (L) 04/10/2019 1813   HCO3 30.6 (H) 04/10/2019 1813   TCO2 32 04/10/2019 1813   ACIDBASEDEF 2.0 04/08/2019 1528  O2SAT 93.0 04/10/2019 1813    Septic shock secondary to MRSA pneumonia and COVID-19: Sepsis pathophysiology is overall better-was not requiring pressors this morning. Sputum cultures positive for MRSA-remains on vancomycin.  Has completed a course of Remdesivir on 7/5 for COVID-19.  Hypotension: Developed overnight on 7/5-probably secondary to hypovolemia due to significant amount of urine output-likely from DI and lingering sepsis physiology.  Has required PRN use of Levophed-currently off all pressors this morning with stable BP.  Has had issues with bradycardia when placed on supine position after pruning.    Sinus bradycardia: Probably a vagal response to being supine after being prone.  Has required brief dopamine infusion.  Currently heart rate stable.    Acute metabolic and toxic encephalopathy: Unable to assess mental status as patient is on sedation.  She has neurosarcoidosis at baseline-once her sedation needs have decreased significantly-we can then assess for any neurological improvement.  Given the fact that she had profound hypoxemia and agonal respiration when EMS arrived-she is obviously at risk of some anoxic injury.  CT head on 7/2 without any acute abnormalities.  EEG ordered but currently pending  Transaminitis: Secondary to COVID-19-mild and downtrending.  Follow periodically.  Hypernatremia: Secondary to DI-has known history of panhypopituitarism secondary to neurosarcoidosis.  Sodium level slowly improving-continue SQ DDAVP 3 times daily  dosing-continue free water supplementation via OG tube-continue gentle hydration with half-normal saline.  Urine output overnight has finally decreased to just 1.7 L (has had urine output ranging from 3-5 L over the past few days).  Once sodium levels improved further-we will require down titration of DDAVP dosing.  Panhypopituitarism: Continue steroids-but suspect we can now taper slowly, continue levothyroxine and DDAVP.  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  DM-2: CBG stable with SSI  OSA on CPAP at home  Palliative care: She has multiple comorbidities at baseline including pulmonary and neurosarcoidosis- given severity of her COVID-19 pneumonia with ARDS-her prognosis is very poor.  Furthermore-she has not had any tangible improvement over the past few days.  Dr. Chase Caller has spoken with the patient's sister regarding perhaps transitioning to comfort measures given lack of any clinical improvement over the past few days-sister requests we slowly start talking about end-of-life issues with patient's young daughter.  I tried contacting patient's daughter over the phone on 7/7-unable to get in touch with her-unable to leave voicemail.   Addendum at 2:50 PM: Able to reach patient's daughter-discussed poor prognosis-we briefly discussed hospice/comfort care option versus continuing with current care.  She requests time to discuss with other siblings and family members, have told her that 24 of my partners will touch base with her tomorrow.  All questions were answered.  Condition - Extremely Guarded-as noted above-prognosis is poor at this point.  Family Communication  : Unable to reach patient's daughter on 7/7-Dr. Chase Caller was able to reach patient's sister earlier today.  Code Status : DNR  Diet :  Diet Order            Diet NPO time specified  Diet effective now               Disposition Plan  :   Remain inpatient  Consults  :  PCCM  Procedures  :   Bronchoscopy 7/4 ETT>> 7/1 L IJ CVL 7/1  GI prophylaxis: H2 Blocker  DVT Prophylaxis  :  Lovenox therapeutic dosing  Lab Results  Component Value Date   PLT 217 04/11/2019    Inpatient Medications  Scheduled Meds:  aspirin  81 mg Per Tube Daily   atorvastatin  40 mg Per Tube q1800   chlorhexidine gluconate (MEDLINE KIT)  15 mL Mouth Rinse BID   Chlorhexidine Gluconate Cloth  6 each Topical Daily   desmopressin  2 mcg Subcutaneous TID   enoxaparin (LOVENOX) injection  140 mg Subcutaneous Q12H   famotidine  20 mg Per Tube BID   feeding supplement (PRO-STAT SUGAR FREE 64)  30 mL Per Tube BID   free water  400 mL Per Tube Q6H   guaiFENesin  15 mL Oral Q6H   hydrocortisone sod succinate (SOLU-CORTEF) inj  50 mg Intravenous Q8H   insulin aspart  0-9 Units Subcutaneous Q4H   levothyroxine  125 mcg Per Tube Q0600   mouth rinse  15 mL Mouth Rinse 10 times per day   sodium chloride flush  10-40 mL Intracatheter Q12H   vitamin C  500 mg Per Tube Daily   zinc sulfate  220 mg Per Tube Daily   Continuous Infusions:  sodium chloride Stopped (04/09/19 0634)   dextrose 100 mL/hr at 04/11/19 1200   feeding supplement (VITAL AF 1.2 CAL) 50 mL/hr at 04/11/19 0000   fentaNYL infusion INTRAVENOUS Stopped (04/11/19 1017)   midazolam Stopped (04/11/19 0801)   remdesivir 100 mg in NS 250 mL Stopped (04/10/19 2134)   vancomycin 2,000 mg (04/11/19 1235)   PRN Meds:.atropine, chlorpheniramine-HYDROcodone, fentaNYL, midazolam **OR** midazolam, sodium chloride flush, vecuronium  Antibiotics  :    Anti-infectives (From admission, onward)   Start     Dose/Rate Route Frequency Ordered Stop   04/10/19 2100  remdesivir 100 mg in sodium chloride 0.9 % 250 mL IVPB     100 mg 500 mL/hr over 30 Minutes Intravenous Every 24 hours 04/10/19 1118 04/15/19 2059   04/07/19 1200  vancomycin (VANCOCIN) 2,000 mg in sodium chloride  0.9 % 500 mL IVPB     2,000 mg 250 mL/hr over 120 Minutes Intravenous Every 24 hours 04/07/19 1126     04/06/19 2200  remdesivir 100 mg in sodium chloride 0.9 % 250 mL IVPB     100 mg 500 mL/hr over 30 Minutes Intravenous Every 24 hours 05/02/2019 2206 04/09/19 2146   04/06/19 0800  vancomycin (VANCOCIN) 1,250 mg in sodium chloride 0.9 % 250 mL IVPB  Status:  Discontinued     1,250 mg 166.7 mL/hr over 90 Minutes Intravenous Every 24 hours 04/17/2019 2225 04/07/19 1126   04/06/19 0600  ceFEPIme (MAXIPIME) 2 g in sodium chloride 0.9 % 100 mL IVPB  Status:  Discontinued     2 g 200 mL/hr over 30 Minutes Intravenous Every 8 hours 05/05/2019 2225 04/09/19 1413   04/09/2019 2300  remdesivir 200 mg in sodium chloride 0.9 % 250 mL IVPB     200 mg 500 mL/hr over 30 Minutes Intravenous Once 05/03/2019 2206  2334   04/12/2019 2030  vancomycin (VANCOCIN) 2,500 mg in sodium chloride 0.9 % 500 mL IVPB  Status:  Discontinued     2,500 mg 250 mL/hr over 120 Minutes Intravenous  Once 04/11/2019 1926 04/10/2019 2116   04/07/2019 2000  ceFEPIme (MAXIPIME) 2 g in sodium chloride 0.9 % 100 mL IVPB     2 g 200 mL/hr over 30 Minutes Intravenous  Once 04/28/2019 1926 05/02/2019 2112   05/02/2019 2000  metroNIDAZOLE (FLAGYL) IVPB 500 mg  Status:  Discontinued     500 mg 100 mL/hr over 60 Minutes Intravenous Every 8 hours 04/24/2019 1926 04/08/19 1127  Time Spent in minutes  45   The patient is critically ill with multiple organ system failure and requires high complexity decision making for assessment and support, frequent evaluation and titration of therapies, advanced monitoring, review of radiographic studies and interpretation of complex data.    Oren Binet M.D on 04/11/2019 at 2:12 PM  To page go to [email or web address] - use universal password  [facility] -  Office  [phone]  See all Orders from today for further details   Admit date - 04/22/2019    6    Objective:   Vitals:   04/11/19 0800  04/11/19 0900 04/11/19 1100 04/11/19 1200  BP:      Pulse: (!) 49 (!) 42 (!) 48 62  Resp:      Temp: 97.6 F (36.4 C)     TempSrc: Axillary     SpO2: 100% 99% 100% 96%  Weight:      Height:        Wt Readings from Last 3 Encounters:  04/11/19 (!) 145.8 kg     Intake/Output Summary (Last 24 hours) at 04/11/2019 1412 Last data filed at 04/11/2019 1200 Gross per 24 hour  Intake 5071.84 ml  Output 1100 ml  Net 3971.84 ml     Physical Exam General appearance: Intubated-sedated.  Does not respond to a vigorous sternal rub. Eyes:no scleral icterus. HEENT: Atraumatic and Normocephalic Neck: supple, no JVD. Resp:Good air entry bilaterally,no rales or rhonchi heard anteriorly CVS: S1 S2 regular, no murmurs.  GI: Bowel sounds present, Non tender and not distended with no gaurding, rigidity or rebound. Extremities: B/L Lower Ext shows no edema, both legs are warm to touch    Data Review:    CBC Recent Labs  Lab 04/06/19 0310  04/07/19 0500  04/08/19 0500  04/09/19 0419 04/09/19 0812 04/10/19 0450 04/10/19 1450 04/10/19 1813 04/11/19 0753  WBC 14.7*  --  9.6  --  8.2  --  7.4  --  7.9 10.3  --  8.2  HGB 11.4*   < > 9.5*   < > 10.2*   < > 10.3* 10.9* 10.5* 10.0* 11.2* 10.4*  HCT 42.5   < > 35.9*   < > 38.5   < > 37.4 32.0* 38.8 36.1 33.0* 37.7  PLT 192  --  152  --  171  --  165  --  215 230  --  217  MCV 94.7  --  95.7  --  94.6  --  93.7  --  94.4 94.3  --  94.7  MCH 25.4*  --  25.3*  --  25.1*  --  25.8*  --  25.5* 26.1  --  26.1  MCHC 26.8*  --  26.5*  --  26.5*  --  27.5*  --  27.1* 27.7*  --  27.6*  RDW 17.3*  --  17.7*  --  17.8*  --  18.1*  --  18.0* 18.1*  --  18.2*  LYMPHSABS 0.8  --  0.8  --  0.9  --  1.0  --  1.5  --   --   --   MONOABS 1.1*  --  0.5  --  0.5  --  0.5  --  0.9  --   --   --   EOSABS 0.0  --  0.0  --  0.0  --  0.1  --  0.0  --   --   --   BASOSABS 0.0  --  0.0  --  0.0  --  0.1  --  0.0  --   --   --    < > = values in this interval not  displayed.    Chemistries  Recent Labs  Lab 04/07/19 0500  04/07/19 1719 04/08/19 0500  04/08/19 2222 04/09/19 0419 04/09/19 0812 04/10/19 0450 04/10/19 1813 04/11/19 0516 04/11/19 0753  NA 142   < >  --  152*   < > 155* 161* 168* 164* 160*  --  157*  K 3.7   < >  --  3.9   < > 3.5 3.4* 3.1* 3.3* 3.7  --  3.6  CL 110  --   --  118*  --  124* 128*  --  128*  --   --  121*  CO2 24  --   --  28  --  26 26  --  31  --   --  27  GLUCOSE 415*  --   --  154*  --  120* 115*  --  88  --   --  150*  BUN 21*  --   --  27*  --  31* 29*  --  25*  --   --  23*  CREATININE 0.78  --   --  0.76  --  0.86 0.84  --  0.76  --  0.74 0.66  CALCIUM 6.5*  --   --  7.4*  --  7.3* 7.7*  --  7.4*  --   --  7.3*  MG 1.8  --  2.3 2.5*  --  2.3 2.4  --  2.0  --   --   --   AST 66*  --   --  51*  --   --  51*  --  37  --   --  28  ALT 28  --   --  29  --   --  31  --  27  --   --  26  ALKPHOS 50  --   --  58  --   --  64  --  62  --   --  55  BILITOT 0.1*  --   --  0.7  --   --  0.3  --  0.6  --   --  0.3   < > = values in this interval not displayed.   ------------------------------------------------------------------------------------------------------------------ No results for input(s): CHOL, HDL, LDLCALC, TRIG, CHOLHDL, LDLDIRECT in the last 72 hours.  No results found for: HGBA1C ------------------------------------------------------------------------------------------------------------------ Recent Labs    04/08/19 2222  TSH <0.010*   ------------------------------------------------------------------------------------------------------------------ Recent Labs    04/10/19 0450 04/11/19 0753  FERRITIN 131 120    Coagulation profile Recent Labs  Lab 04/26/2019 2015  INR 1.2    Recent Labs    04/10/19 0450 04/11/19 0753  DDIMER 6.78* 3.55*    Cardiac Enzymes No results for input(s): CKMB, TROPONINI, MYOGLOBIN in the last 168 hours.  Invalid input(s):  CK ------------------------------------------------------------------------------------------------------------------    Component Value Date/Time   BNP 376.5 (H) 04/11/2019 0753    Micro Results Recent Results (from the past 240 hour(s))  MRSA PCR Screening     Status: Abnormal   Collection Time: 04/06/19  2:45 AM   Specimen: Nasopharyngeal  Result Value Ref Range Status   MRSA by PCR POSITIVE (A) NEGATIVE Final    Comment:        The GeneXpert MRSA Assay (FDA approved for  NASAL specimens only), is one component of a comprehensive MRSA colonization surveillance program. It is not intended to diagnose MRSA infection nor to guide or monitor treatment for MRSA infections. RESULT CALLED TO, READ BACK BY AND VERIFIED WITH: Arta Silence [id number] @ [id number] BY J SCOTTON Performed at [facility] [address] [place], [place] [address]   Culture, respiratory (non-expectorated)     Status: None   Collection Time: 04/06/19 11:36 AM   Specimen: Tracheal Aspirate; Respiratory  Result Value Ref Range Status   Specimen Description   Final    TRACHEAL ASPIRATE Performed at [facility] [address], [place], [place] [address]    Special Requests   Final    NONE Performed at [facility], [place] [address] [place],  [address]    Gram Stain   Final    ABUNDANT WBC PRESENT,BOTH PMN AND MONONUCLEAR NO ORGANISMS SEEN Performed at [facility], [place] [address] [place],  [address]    Culture RARE METHICILLIN RESISTANT STAPHYLOCOCCUS AUREUS  Final   Report Status 04/09/2019 FINAL  Final   Organism ID, Bacteria METHICILLIN RESISTANT STAPHYLOCOCCUS AUREUS  Final      Susceptibility   Methicillin resistant staphylococcus aureus - MIC*    CIPROFLOXACIN >=8 RESISTANT Resistant     ERYTHROMYCIN >=8 RESISTANT Resistant     GENTAMICIN <=0.5 SENSITIVE Sensitive     OXACILLIN >=4 RESISTANT Resistant     TETRACYCLINE  <=1 SENSITIVE Sensitive     VANCOMYCIN 1 SENSITIVE Sensitive     TRIMETH/SULFA <=10 SENSITIVE Sensitive     CLINDAMYCIN <=0.25 SENSITIVE Sensitive     RIFAMPIN <=0.5 SENSITIVE Sensitive     Inducible Clindamycin NEGATIVE Sensitive     * RARE METHICILLIN RESISTANT STAPHYLOCOCCUS AUREUS    Radiology Reports Dg Abd 1 View  Result Date: 04/10/2019 CLINICAL DATA:  Nasogastric tube placement EXAM: ABDOMEN - 1 VIEW COMPARISON:  Plain film of the abdomen dated 04/16/2019. FINDINGS: Nasogastric tube appears adequately positioned in the stomach. Visualized bowel gas pattern is nonobstructive. IMPRESSION: Nasogastric tube appears adequately positioned in the stomach. Electronically Signed   By: Franki Cabot M.D.   On: 04/10/2019 11:26   Ct Head Wo Contrast  Result Date: 04/06/2019 CLINICAL DATA:  48 y/o F; unexplained altered level of consciousness. Found unresponsive. Sepsis and COVID-19 positive. EXAM: CT HEAD WITHOUT CONTRAST TECHNIQUE: Contiguous axial images were obtained from the base of the skull through the vertex without intravenous contrast. COMPARISON:  None. FINDINGS: Brain: No evidence of acute infarction, hemorrhage, hydrocephalus, extra-axial collection or mass effect. Coarse calcifications are present which appear to be centered in the region of hypothalamus and mamillary bodies. Vascular: No hyperdense vessel or unexpected calcification. Skull: Normal. Negative for fracture or focal lesion. Sinuses/Orbits: Right maxillary sinus mucosal thickening. Large left maxillary sinus mucous retention cyst or polyp. Additional included paranasal sinuses and the mastoid air cells are normally aerated. Nasoenteric tube and endotracheal tube noted. Orbits are unremarkable. Other: None. IMPRESSION: 1. No acute intracranial abnormality identified. 2. Coarse calcifications which appears centered in the region of hypothalamus and mamillary bodies. Findings may represent a small underlying mass such as  craniopharyngioma, glioma, and hamartoma or possibly sequelae of prior toxic/inflammatory process. Consider MRI of the brain without and with contrast to better characterize. Electronically Signed   By: Kristine Garbe M.D.   On: 04/06/2019 02:24   Dg Chest Port 1 View  Result Date: 04/11/2019 CLINICAL DATA:  Respiratory failure.  Hypoxia. EXAM: PORTABLE CHEST 1 VIEW COMPARISON:  04/10/2019. FINDINGS: Endotracheal tube, NG tube, right PICC line stable position. Stable cardiomegaly. Diffuse bilateral pulmonary infiltrates/edema again noted. Low lung volumes with progressive bibasilar atelectasis/consolidation. Left pleural effusion cannot be excluded. No pneumothorax. IMPRESSION: 1.  Lines and tubes in stable position. 2.  Stable cardiomegaly. 3. Diffuse bilateral pulmonary infiltrates/edema again noted. Low lung volumes with progressive bibasilar atelectasis/consolidation. Left pleural effusion cannot be excluded. Electronically Signed   By: Marcello Moores  Register   On: 04/11/2019 06:47   Dg Chest Port 1 View  Result Date: 04/10/2019 CLINICAL DATA:  ARDS. EXAM: PORTABLE CHEST 1 VIEW COMPARISON:  04/08/2019. FINDINGS: Interval removal of left IJ line. Endotracheal tube, NG tube, right PICC line stable position. Cardiomegaly. Diffuse bilateral pulmonary infiltrates/edema again noted. No prominent pleural effusion or pneumothorax. IMPRESSION: 1. Interval removal of left IJ line, remaining lines and tubes in stable position. No pneumothorax. 2. Stable cardiomegaly. Stable diffuse bilateral pulmonary interstitial infiltrates/edema again noted. Electronically Signed   By: Marcello Moores  Register   On: 04/10/2019 13:59   Dg Chest Port 1 View  Result Date: 04/08/2019 CLINICAL DATA:  Hypoxia. EXAM: PORTABLE CHEST 1 VIEW COMPARISON:  Radiograph same day. FINDINGS: Stable cardiomediastinal silhouette. Endotracheal and nasogastric tubes are unchanged in position. Left internal jugular catheter is unchanged in position. No  pneumothorax is noted. Right-sided PICC line is noted with distal tip in expected position of cavoatrial junction. Stable diffuse lung opacities are noted most consistent with pneumonia or edema. Small bilateral pleural effusions may be present. Bony thorax is unremarkable. IMPRESSION: Interval placement of right-sided PICC line with distal tip in expected position of cavoatrial junction. Otherwise stable support apparatus. Stable bilateral lung opacities are noted concerning for pneumonia or edema with possible small pleural effusions. Electronically Signed   By: Marijo Conception M.D.   On: 04/08/2019 15:22   Dg Chest Port 1 View  Result Date: 04/08/2019 CLINICAL DATA:  Worsening oxygenation. EXAM: PORTABLE CHEST 1 VIEW COMPARISON:  Chest radiograph 04/06/2019 FINDINGS: ET tube mid trachea. Left IJ central venous catheter tip projects over the right atrium. Enteric tube side port terminates in the mid esophagus. Stable cardiomegaly. Monitoring leads overlie the patient. Similar-appearing diffuse bilateral airspace opacities. No large pleural effusion or pneumothorax. Thoracic spine degenerative changes. IMPRESSION: ET tube side-port projects in the midesophagus, recommend advancement. Otherwise similar-appearing bilateral diffuse airspace opacities. Electronically Signed   By: Lovey Newcomer M.D.   On: 04/08/2019 11:41   Dg Chest Port 1 View  Result Date: 04/06/2019 CLINICAL DATA:  Shortness of breath. EXAM: PORTABLE CHEST 1 VIEW COMPARISON:  04/06/2019. FINDINGS: Endotracheal tube, NG tube, left IJ line stable position. Stable cardiomegaly. Diffuse severe bilateral pulmonary infiltrates/edema again noted. No prominent pleural effusion. No pneumothorax. IMPRESSION: 1.  Lines and tubes stable position. 2. Stable cardiomegaly. Diffuse severe bilateral pulmonary infiltrates/edema again noted. No interim change. Electronically Signed   By: Marcello Moores  Register   On: 04/06/2019 08:42   Dg Chest Port 1 View  Result  Date: 04/06/2019 CLINICAL DATA:  Respiratory failure.  COVID-19 positive. EXAM: PORTABLE CHEST 1 VIEW COMPARISON:  No recent prior. FINDINGS: Left IJ line noted with tip over cavoatrial junction. NG tube noted with tip below left hemidiaphragm. Cardiomegaly. Diffuse bilateral pulmonary infiltrates/edema. Tiny left effusion cannot be excluded. No pneumothorax. IMPRESSION: 1. Left IJ line noted with tip over cavoatrial junction. NG tube noted with tip below left hemidiaphragm. 2.  Cardiomegaly. 3. Severe diffuse bilateral pulmonary infiltrates/edema. Tiny left pleural effusion cannot  be excluded. Electronically Signed   By: Marcello Moores  Register   On: 04/06/2019 07:36   Dg Abd Portable 1v  Result Date: 04/24/2019 CLINICAL DATA:  Check gastric catheter placement EXAM: PORTABLE ABDOMEN - 1 VIEW COMPARISON:  None. FINDINGS: Scattered large and small bowel gas is noted. Gastric catheter is noted within the distal aspect of the stomach. Patchy infiltrates are noted within the lungs bilaterally. IMPRESSION: Gastric catheter within the stomach. Electronically Signed   By: Inez Catalina M.D.   On: 04/10/2019 21:55   [place] Ekg Site Rite  Result Date: 04/07/2019 If Site Rite image not attached, placement could not be confirmed due to current cardiac rhythm.

## 2019-04-11 NOTE — Progress Notes (Signed)
Spoke with Jola Baptist (sister) and updated her. She requested the doctor to call her and update her today.

## 2019-04-11 NOTE — Progress Notes (Signed)
ANTICOAGULATION CONSULT NOTE - Follow Up Consult  Pharmacy Consult for Lovenox Indication: suspected VTE  No Known Allergies  Patient Measurements: Height: _0  (157.5 cm) Weight: (!) 321 lb 8 oz (145.8 kg) IBW/kg (Calculated) : 50.1  Vital Signs: Temp: 97.6 F (36.4 C) (07/07 0800) Temp Source: Axillary (07/07 0800) BP: 117/64 (07/07 0737) Pulse Rate: 49 (07/07 0800)  Labs: Recent Labs    04/08/19 2222  04/09/19 0419 04/09/19 0646  04/10/19 0450 04/10/19 1450 04/10/19 1813 04/11/19 0516 04/11/19 0753  HGB  --   --  10.3*  --    < > 10.5* 10.0* 11.2*  --  10.4*  HCT  --   --  37.4  --    < > 38.8 36.1 33.0*  --  37.7  PLT  --    < > 165  --   --  215 230  --   --  217  CREATININE 0.86  --  0.84  --   --  0.76  --   --  0.74  --   TROPONINIHS 217*  --   --  316.0*  --   --   --   --   --   --    < > = values in this interval not displayed.    Estimated Creatinine Clearance: 117.4 mL/min (by C-G formula based on SCr of 0.74 mg/dL).   Medical History: Past Medical History:  Diagnosis Date  . CHF (congestive heart failure) ([place])   . Diabetes insipidus ([place])   . GERD (gastroesophageal reflux disease)   . Glaucoma    L eye blind  . HTN (hypertension)   . Hypothyroidism   . Obesity hypoventilation syndrome ([facility])    on chronic home O2 2L [place]  . OSA (obstructive sleep apnea)   . Osteoporosis   . Panhypopituitarism ([place])   . Pulmonary hypertension ([place])   . Sarcoidosis    w/chronic steroid use    Medications:  Scheduled:  . aspirin  81 mg Per Tube Daily  . atorvastatin  40 mg Per Tube q1800  . chlorhexidine gluconate (MEDLINE KIT)  15 mL Mouth Rinse BID  . Chlorhexidine Gluconate Cloth  6 each Topical Daily  . desmopressin  2 mcg Subcutaneous TID  . enoxaparin (LOVENOX) injection  140 mg Subcutaneous Q12H  . famotidine  20 mg Per Tube BID  . feeding supplement (PRO-STAT SUGAR FREE 64)  30 mL Per Tube BID  . free water  400 mL Per Tube Q6H  . guaiFENesin   15 mL Oral Q6H  . hydrocortisone sod succinate (SOLU-CORTEF) inj  50 mg Intravenous Q8H  . insulin aspart  0-9 Units Subcutaneous Q4H  . levothyroxine  125 mcg Per Tube Q0600  . mouth rinse  15 mL Mouth Rinse 10 times per day  . mupirocin ointment  1 application Nasal BID  . sodium chloride flush  10-40 mL Intracatheter Q12H  . vitamin C  500 mg Per Tube Daily  . zinc sulfate  220 mg Per Tube Daily   Infusions:  . sodium chloride Stopped (04/09/19 0634)  . dextrose 100 mL/hr at 04/11/19 0900  . feeding supplement (VITAL AF 1.2 CAL) 50 mL/hr at 04/11/19 0000  . fentaNYL infusion INTRAVENOUS 75 mcg/hr (04/11/19 0900)  . midazolam Stopped (04/11/19 0801)  . norepinephrine (LEVOPHED) Adult infusion 1 mcg/min (04/11/19 0900)  . remdesivir 100 mg in NS 250 mL Stopped (04/10/19 2134)  . 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  Pharmacy is consulted to increase from prophylatic to treatment dose Lovenox today for suspected VTE event d/t hypoxia and acute worsening.    Today, 04/11/2019  CBC: Hgb low/stable at 10.4, Plt WNL, No s/s of overt bleeding per RN SCr <1 with CrCl > 100 ml/min.   Goal of Therapy:  Anti-Xa level 0.6-1 units/ml 4hrs after LMWH dose given Monitor platelets by anticoagulation protocol: Yes   Plan:  Continue Lovenox 1 mg/kg (140 mg) SQ BID. Monitor renal fx and s/s of bleeding  Albertina Parr, PharmD., BCPS Clinical Pharmacist Clinical phone for 04/11/19 until 5pm: [phone]

## 2019-04-11 NOTE — Progress Notes (Signed)
Spoke with pts daughter. Updated her and answered all questions she had at this time.

## 2019-04-12 ENCOUNTER — Inpatient Hospital Stay (HOSPITAL_COMMUNITY): Payer: Medicaid Other

## 2019-04-12 LAB — COMPREHENSIVE METABOLIC PANEL
ALT: 24 U/L (ref 0–44)
AST: 25 U/L (ref 15–41)
Albumin: 2.2 g/dL — ABNORMAL LOW (ref 3.5–5.0)
Alkaline Phosphatase: 53 U/L (ref 38–126)
Anion gap: 3 — ABNORMAL LOW (ref 5–15)
BUN: 24 mg/dL — ABNORMAL HIGH (ref 6–20)
CO2: 33 mmol/L — ABNORMAL HIGH (ref 22–32)
Calcium: 7.8 mg/dL — ABNORMAL LOW (ref 8.9–10.3)
Chloride: 114 mmol/L — ABNORMAL HIGH (ref 98–111)
Creatinine, Ser: 0.56 mg/dL (ref 0.44–1.00)
GFR calc Af Amer: >60 mL/min (ref >60)
GFR calc non Af Amer: >60 mL/min (ref >60)
Glucose, Bld: 83 mg/dL (ref 70–99)
Potassium: 3.3 mmol/L — ABNORMAL LOW (ref 3.5–5.1)
Sodium: 150 mmol/L — ABNORMAL HIGH (ref 135–145)
Total Bilirubin: 0.4 mg/dL (ref 0.3–1.2)
Total Protein: 6 g/dL — ABNORMAL LOW (ref 6.5–8.1)

## 2019-04-12 LAB — GLUCOSE, CAPILLARY
Glucose-Capillary: 103 mg/dL — ABNORMAL HIGH (ref 70–99)
Glucose-Capillary: 78 mg/dL (ref 70–99)
Glucose-Capillary: 97 mg/dL (ref 70–99)
Glucose-Capillary: 87 mg/dL (ref 70–99)
Glucose-Capillary: 84 mg/dL (ref 70–99)

## 2019-04-12 LAB — C-REACTIVE PROTEIN: CRP: 2 mg/dL — ABNORMAL HIGH (ref <1.0)

## 2019-04-12 LAB — CBC
HCT: 35.2 % — ABNORMAL LOW (ref 36.0–46.0)
Hemoglobin: 9.7 g/dL — ABNORMAL LOW (ref 12.0–15.0)
MCH: 25.9 pg — ABNORMAL LOW (ref 26.0–34.0)
MCHC: 27.6 g/dL — ABNORMAL LOW (ref 30.0–36.0)
MCV: 93.9 fL (ref 80.0–100.0)
Platelets: 213 10*3/uL (ref 150–400)
RBC: 3.75 MIL/uL — ABNORMAL LOW (ref 3.87–5.11)
RDW: 18.2 % — ABNORMAL HIGH (ref 11.5–15.5)
WBC: 9.7 10*3/uL (ref 4.0–10.5)
nRBC: 3.3 % — ABNORMAL HIGH (ref 0.0–0.2)

## 2019-04-12 LAB — FERRITIN: Ferritin: 103 ng/mL (ref 11–307)

## 2019-04-12 LAB — D-DIMER, QUANTITATIVE: D-Dimer, Quant: 3.64 ug/mL-FEU — ABNORMAL HIGH (ref 0.00–0.50)

## 2019-04-12 LAB — BRAIN NATRIURETIC PEPTIDE: B Natriuretic Peptide: 341.2 pg/mL — ABNORMAL HIGH (ref 0.0–100.0)

## 2019-04-12 MED ORDER — FUROSEMIDE 10 MG/ML IJ SOLN
40.0000 mg | Freq: Four times a day (QID) | INTRAMUSCULAR | Status: AC
  Administered 2019-04-12 (×2): 40 mg via INTRAVENOUS
  Filled 2019-04-12 (×2): qty 4

## 2019-04-12 MED ORDER — POTASSIUM CHLORIDE 20 MEQ/15ML (10%) PO SOLN
40.0000 meq | Freq: Once | ORAL | Status: AC
  Administered 2019-04-12: 40 meq
  Filled 2019-04-12: qty 30

## 2019-04-12 NOTE — Progress Notes (Signed)
NAME:  Christine GemmaJennifer Lacerte, MRN:  [id number], DOB:  06-Apr-1971, LOS: 7 ADMISSION DATE:  05/05/2019, CONSULTATION DATE:  7/2 REFERRING MD:  Thedore MinsSingh, CHIEF COMPLAINT:  Dyspnea   Brief History   48 y/o female with complex medical history admitted for ARDS from COVID pneumonia.  There was concern for aspiration pneumonia on admission.   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, no pulmonary involvement initially; worsening symptoms 2019, started on infliximab; 2020 HRCT showed findings worrisome for pulmonary parenchymal disease from sarcoidosis, likely pulmonary hypertension Prior tobacco abuse  Significant Hospital Events   7/2 admission, prone 7/3 prone 7/4 mucus plug after moved to supine position, severe hypoxemia, emergent bronchoscopy with mucus plug left lower lboe, emergent bedside echo showed normal LV contractility, hypocontractile RV  Consults:  PCCM  Procedures:  7/1 ETT >  7/2 L IJ CVL >  7/4 Emergent bronchoscopy: mucus plugging left lower lobe, emergent bedside echo: normal LV contractility, no pericardial fluid, dilated hypocontractile RV  Significant Diagnostic Tests:  12/2018 HRCT showed findings worrisome for pulmonary parenchymal disease from sarcoidosis, likely pulmonary hypertension 11/2017 TTE> moderate LVH, LVEF normal, RV enlarted, hypocontractile free wall, mild global decrease, RVSP 38 mmHg; atrial shunting noted  Micro Data:  6/30 SARS-COV-2> positive 6/30 blood culture OSH > 1/4 coag neg staph 7/1 urine culture > klebsiella 7/2 resp culture > MRSA   Antimicrobials:  7/1 cefepime> 7/4 7/1 flagyl > 7/4 7/1 remdesivir >  7/1vancomycin >  7/1 hydrocortisone >  Interim history/subjective:  Remains on mechanical ventilation, Remains critically ill Making urine   Objective    Blood pressure 132/74, pulse (!) 59, temperature 98.6 F (37 C), temperature source Oral, resp. rate (!) 45, height 5\' 2"  (1.575 m), weight (!) 149.7 kg, SpO2 95 %.    Vent Mode: PRVC FiO2 (%):  [60 %-100 %] 100 % Set Rate:  [35 bmp] 35 bmp Vt Set:  [400 mL] 400 mL PEEP:  [12 cmH20] 12 cmH20 Pressure Support:  [14 cmH20] 14 cmH20 Plateau Pressure:  [22 cmH20-31 cmH20] 31 cmH20   Intake/Output Summary (Last 24 hours) at 04/12/2019 0800 Last data filed at 04/12/2019 0600 Gross per 24 hour  Intake 4585.55 ml  Output 2205 ml  Net 2380.55 ml   Filed Weights   04/10/19 0500 04/11/19 0500 04/12/19 0437  Weight: (!) 148.2 kg (!) 145.8 kg (!) 149.7 kg    Examination:  General:  In bed on vent HENT: NCAT ETT in place PULM: CTA B, vent supported breathing CV: RRR, no mgr GI: BS+, soft, nontender MSK: normal bulk and tone Neuro: sedated on vent    Resolved Hospital Problem list     Assessment & Plan:  ARDS due to COVID 19 pneumonia Pulmonary hypertension Atrial shunt Concern for pulmonary embolism: too unstable for CT angiogram Continue full mechanical ventilatory support with ARDS protocol, targeting tidal volume 6 to 8 cc/kg ideal body weight, plateau pressure less than 30 cm of water, driving pressure less than 15 cm of water Ventilator associated pneumonia prevention protocol Diurese as able Target PaO2 is 55-65 Repeat ABG to decide on prone positioning  Continue intermittent vecuronium protocol, RASS goal -4 to -5 Continue full dose enoxaparin for now  Circulatory shock: decompensated cor pulmonale Minimize fluid boluses Diurese as able  MRSA pneumonia present on admission Complete course of vancomycine  Pan Hypopit, DI DDAVP Synthroid Monitor Na hydrocortisone  Neuro and pulmonary sarcoidosis Sedation as above  Best practice:  Diet: tube feeding Pain/Anxiety/Delirium protocol (if indicated): RASS goal -4 to -5, fentanyl, versed infusions VAP protocol (if  indicated): yes DVT prophylaxis: full dose lovenox GI prophylaxis: famotidine Glucose control: Per TRH Mobility: bed rest Code Status: DNR if arrests, full support otherwise Family Communication: will update today Disposition: remain in ICU  Labs   CBC: Recent Labs  Lab 04/06/19 0310  04/07/19 0500  04/08/19 0500  04/09/19 0419  04/10/19 0450 04/10/19 1450 04/10/19 1813 04/11/19 0753 04/12/19 0535  WBC 14.7*  --  9.6  --  8.2  --  7.4  --  7.9 10.3  --  8.2 9.7  NEUTROABS 12.6*  --  8.1*  --  6.6  --  5.2  --  5.0  --   --   --   --   HGB 11.4*   < > 9.5*   < > 10.2*   < > 10.3*   < > 10.5* 10.0* 11.2* 10.4* 9.7*  HCT 42.5   < > 35.9*   < > 38.5   < > 37.4   < > 38.8 36.1 33.0* 37.7 35.2*  MCV 94.7  --  95.7  --  94.6  --  93.7  --  94.4 94.3  --  94.7 93.9  PLT 192  --  152  --  171  --  165  --  215 230  --  217 213   < > = values in this interval not displayed.    Basic Metabolic Panel: Recent Labs  Lab 04/07/19 0500  04/07/19 1719 04/08/19 0500  04/08/19 2222 04/09/19 0419 04/09/19 1601 04/10/19 0450 04/10/19 1813 04/11/19 0516 04/11/19 0753 04/12/19 0535  NA 142   < >  --  152*   < > 155* 161* 168* 164* 160*  --  157* 150*  K 3.7   < >  --  3.9   < > 3.5 3.4* 3.1* 3.3* 3.7  --  3.6 3.3*  CL 110  --   --  118*  --  124* 128*  --  128*  --   --  121* 114*  CO2 24  --   --  28  --  26 26  --  31  --   --  27 33*  GLUCOSE 415*  --   --  154*  --  120* 115*  --  88  --   --  150* 83  BUN 21*  --   --  27*  --  31* 29*  --  25*  --   --  23* 24*  CREATININE 0.78  --   --  0.76  --  0.86 0.84  --  0.76  --  0.74 0.66 0.56  CALCIUM 6.5*  --   --  7.4*  --  7.3* 7.7*  --  7.4*  --   --  7.3* 7.8*  MG 1.8  --  2.3 2.5*  --  2.3 2.4  --  2.0  --   --   --   --   PHOS 1.1*  --  1.0* 1.7*  --   --  1.2*  --  2.8  --   --   --   --    < > = values in this interval not displayed.   GFR: Estimated Creatinine Clearance: 119.4 mL/min (by C-G formula based on SCr of 0.56  mg/dL). Recent  Labs  Lab 2019/05/08 2015 2019/05/08 2250  04/06/19 0315 04/07/19 0500 04/08/19 0500 04/09/19 0419 04/10/19 0450 04/10/19 1450 04/11/19 0753 04/12/19 0535  PROCALCITON  --   --   --  3.20 1.23 0.68 0.50  --   --   --   --   WBC 16.9*  --    < >  --  9.6 8.2 7.4 7.9 10.3 8.2 9.7  LATICACIDVEN 1.4 1.2  --   --   --   --   --   --   --   --   --    < > = values in this interval not displayed.    Liver Function Tests: Recent Labs  Lab 04/08/19 0500 04/09/19 0419 04/10/19 0450 04/11/19 0753 04/12/19 0535  AST 51* 51* 37 28 25  ALT 29 31 27 26 24   ALKPHOS 58 64 62 55 53  BILITOT 0.7 0.3 0.6 0.3 0.4  PROT 6.4* 6.3* 6.1* 5.3* 6.0*  ALBUMIN 2.2* 2.2* 2.1* 1.9* 2.2*   No results for input(s): LIPASE, AMYLASE in the last 168 hours. No results for input(s): AMMONIA in the last 168 hours.  ABG    Component Value Date/Time   PHART 7.354 04/10/2019 1813   PCO2ART 54.8 (H) 04/10/2019 1813   PO2ART 72.0 (L) 04/10/2019 1813   HCO3 30.6 (H) 04/10/2019 1813   TCO2 32 04/10/2019 1813   ACIDBASEDEF 2.0 04/08/2019 1528   O2SAT 93.0 04/10/2019 1813     Coagulation Profile: Recent Labs  Lab 2019/05/08 2015  INR 1.2    Cardiac Enzymes: No results for input(s): CKTOTAL, CKMB, CKMBINDEX, TROPONINI in the last 168 hours.  HbA1C: No results found for: HGBA1C  CBG: Recent Labs  Lab 04/11/19 1237 04/11/19 1626 04/11/19 2000 04/11/19 2338 04/12/19 0322  GLUCAP 127* 103* 101* 131* 103*     Critical care time: 35 minutes    Heber CarolinaBrent Denae Zulueta, MD [facility] Pager: [phone] Cell: [phone] If no response, call [phone]

## 2019-04-12 NOTE — Progress Notes (Signed)
[facility]  I have updated the patient's daughter this afternoon.  She understands that her mother is in critical condition and may not survive.  She is contemplating withdrawal of care.  Roselie Awkward, MD [place] [facility] Pager: [phone] Cell: [phone] If no response, call [phone]

## 2019-04-12 NOTE — Progress Notes (Signed)
   04/12/19 1000  Clinical Encounter Type  Visited With Family;Other (Comment) (Patient Experience )  Visit Type Initial;Psychological support;Spiritual support  Referral From Nurse;Other (Comment) (Patient Experience Coordinator)  Consult/Referral To Chaplain  Spiritual Encounters  Spiritual Needs Emotional;Other (Comment);Sacred text (Spiritual Care Conversation/Support)  Stress Factors  Patient Stress Factors Not reviewed  Family Stress Factors Loss;Lack of knowledge;Major life changes;Other (Comment) (Spiritual Concerns)   I spoke with the patient's daughter per referral from [facility], the patient experience coordinator. Leandra Kern was very receptive to my visit and had many spiritual questions about what happens to our souls when we die.  [name] doesn't want her mom to suffer and be left on machines, but is finding it hard to make a decision about whether to take her off of life support. [name] wanted to discuss her Darrick Meigs beliefs and have reassurance that her mother's soul will go to heaven with other loved ones once she dies.  I provided spiritual council and an empathetic presence.  [name] stated that she was reassured. She would like to video chat with her mother at some point soon.   Please, contact Spiritual Care for further assistance. I will continue to provide support for this family and her daughter.   Chaplain Shanon Ace M.Div., [facility]

## 2019-04-12 NOTE — Progress Notes (Addendum)
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  Reason for Visit: Acute respiratory disease due to COVID-19  Consultants: Pulmonology  Procedures:  Bronchoscopy 7/4 ETT>> 7/1 L IJ CVL 7/1  Antibiotics: Anti-infectives (From admission, onward)   Start     Dose/Rate Route Frequency Ordered Stop   04/10/19 2100  remdesivir 100 mg in sodium chloride 0.9 % 250 mL IVPB  Status:  Discontinued     100 mg 500 mL/hr over 30 Minutes Intravenous Every 24 hours 04/10/19 1118 04/12/19 1130   04/07/19 1200  vancomycin (VANCOCIN) 2,000 mg in sodium chloride 0.9 % 500 mL IVPB     2,000 mg 250 mL/hr over 120 Minutes Intravenous Every 24 hours 04/07/19 1126     04/06/19 2200  remdesivir 100 mg in sodium chloride 0.9 % 250 mL IVPB     100 mg 500 mL/hr over 30 Minutes Intravenous Every 24 hours 05/05/2019 2206 04/09/19 2146   04/06/19 0800  vancomycin (VANCOCIN) 1,250 mg in sodium chloride 0.9 % 250 mL IVPB  Status:  Discontinued     1,250 mg 166.7 mL/hr over 90 Minutes Intravenous Every 24 hours 04/20/2019 2225 04/07/19 1126   04/06/19 0600  ceFEPIme (MAXIPIME) 2 g in sodium chloride 0.9 % 100 mL IVPB  Status:  Discontinued     2 g 200 mL/hr over 30 Minutes Intravenous Every 8 hours 04/17/2019 2225 04/09/19 1413   05/02/2019 2300  remdesivir 200 mg in sodium chloride 0.9 % 250 mL IVPB     200  mg 500 mL/hr over 30 Minutes Intravenous Once 04/16/2019 2206 05/04/2019 2334   04/09/2019 2030  vancomycin (VANCOCIN) 2,500 mg in sodium chloride 0.9 % 500 mL IVPB  Status:  Discontinued     2,500 mg 250 mL/hr over 120 Minutes Intravenous  Once 04/22/2019 1926 04/17/2019 2116   04/18/2019 2000  ceFEPIme (MAXIPIME) 2 g in sodium chloride 0.9 % 100 mL IVPB     2 g 200 mL/hr over 30 Minutes Intravenous  Once 05/05/2019 1926 05/01/2019 2112   04/10/2019 2000  metroNIDAZOLE (FLAGYL) IVPB 500 mg  Status:  Discontinued     500 mg 100 mL/hr over 60 Minutes Intravenous Every 8 hours 04/29/2019 1926 04/08/19 1127       Subjective/Interval History: Patient remains intubated and sedated.  Does not really respond to painful stimulii or voice.   Assessment/Plan:  Acute Hypoxic Resp. Failure due to Acute Covid 19 Viral Illness/MRSA pneumonia/aspiration pneumonia  Vent Mode: PRVC FiO2 (%):  [60 %-100 %] 80 % Set Rate:  [35 bmp] 35 bmp Vt Set:  [400 mL] 400 mL PEEP:  [12 cmH20] 12 cmH20 Plateau Pressure:  [22 cmH20-31 cmH20] 22 cmH20     Component Value Date/Time   PHART 7.354 04/10/2019 1813   PCO2ART 54.8 (H) 04/10/2019 1813   PO2ART 72.0 (L) 04/10/2019 1813   HCO3 30.6 (H) 04/10/2019 1813   TCO2 32 04/10/2019 1813   ACIDBASEDEF 2.0 04/08/2019 1528   O2SAT 93.0 04/10/2019 1813    COVID-19 Labs  Recent Labs    04/10/19 0450 04/11/19 0753 04/12/19 0400 04/12/19 0535  DDIMER 6.78* 3.55*  --  3.64*  FERRITIN 131 120 103  --   CRP 3.1* 2.4* 2.0*  --     Fever: Patient has been afebrile the last 48 hours Oxygen requirements: On mechanical ventilation.  80% FiO2.  Saturating in the 90s. Antibiotics: Patient is on vancomycin Remdesivir: On 10-day course Steroids: On hydrocortisone Diuretics: Not on scheduled diuretics Actemra: Has not received it yet Convalescent Plasma: Not yet Vitamin C and Zinc: Continue DVT Prophylaxis:  Lovenox 140 mg subcu every 12 hours  Patient remains mechanically  ventilated.  Pulmonology is following.  Patient remains on Remdesivir presumably a 10-day course.  Patient remains on steroids.  Patient remains on IV vancomycin.  On 7/4 patient had significant desaturation and underwent emergent bedside bronchoscopy which revealed a mucous plug.  Inflammatory markers have improved.  D-dimer 3.64.  Noted to be on therapeutic dose of Lovenox which was initiated on 7/4 when the patient had significant hypoxemia due to concern for VTE.  Will check lower extremity Dopplers.  Septic shock secondary to MRSA pneumonia Sepsis physiology has improved.  Was on pressors previously.  Currently off of pressors.  Remains on vancomycin for MRSA pneumonia.  Patient is on hydrocortisone for stress dosing.  Sinus bradycardia Noted when she was switched over from prone to supine position.  Thought to be a vagal response.  Required dopamine briefly.  Currently stable.  Acute metabolic and toxic encephalopathy Patient remains on sedation.  Patient has a history of neurosarcoidosis and was on infliximab.  Also on chronic steroids.  CT head done on 7/2 did not show any acute abnormalities.  Patient remains at risk for anoxic brain injury due to profound hypoxemia at initial presentation.  EEG was ordered but is pending at this time.  Transaminitis Secondary to COVID-19.Marland Kitchen  Continue to monitor periodically.  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  Sodium level peaked at 168.  Has been improving.  Patient is on DDAVP and free water through OG tube.  Her urine output was quite high few days ago but that has also improved.  Continue DDAVP for now.  Await further improvement in sodium levels.  Replace potassium  Panhypopituitarism Continue hydrocortisone.  Continue levothyroxine.  Diabetes mellitus type 2 CBG stable with SSI.  Noted to have a CBG of 78 this morning.  Not noted to be on long-acting  insulin.  Continue to check.  History of obstructive sleep apnea On CPAP at home.  Normocytic anemia No evidence of overt bleeding.  Monitor hemoglobin closely.  Goals of care Patient with multiple comorbidities at baseline including pulmonary and neurosarcoidosis.  Her acute illness due to COVID-19 remains severe.  Prognosis appears to be guarded.  Pulmonology has discussed with patient's sister regarding transitioning to comfort care if there is no improvement.  Discussions also held with patient's daughter.  We will continue to discuss with them.  FEN Continue tube feedings.  DVT Prophylaxis: On therapeutic Lovenox PUD Prophylaxis: Famotidine Code Status: DNR Family Communication: Will discuss with family Disposition Plan: Remain in ICU   Medications:  Scheduled:  aspirin  81 mg Per Tube Daily   atorvastatin  40 mg Per Tube q1800   chlorhexidine gluconate (MEDLINE KIT)  15 mL Mouth Rinse BID   Chlorhexidine Gluconate Cloth  6 each Topical Daily   desmopressin  2 mcg Subcutaneous TID   enoxaparin (LOVENOX)  injection  140 mg Subcutaneous Q12H   famotidine  20 mg Per Tube BID   feeding supplement (PRO-STAT SUGAR FREE 64)  30 mL Per Tube BID   free water  400 mL Per Tube Q6H   guaiFENesin  15 mL Oral Q6H   hydrocortisone sod succinate (SOLU-CORTEF) inj  50 mg Intravenous Q8H   insulin aspart  0-9 Units Subcutaneous Q4H   levothyroxine  125 mcg Per Tube Q0600   mouth rinse  15 mL Mouth Rinse 10 times per day   potassium chloride  40 mEq Per Tube Once   sodium chloride flush  10-40 mL Intracatheter Q12H   vitamin C  500 mg Per Tube Daily   zinc sulfate  220 mg Per Tube Daily   Continuous:  sodium chloride 50 mL/hr at 04/12/19 0600   sodium chloride Stopped (04/09/19 0634)   dextrose Stopped (04/11/19 1511)   feeding supplement (VITAL AF 1.2 CAL) 1,000 mL (04/11/19 1820)   fentaNYL infusion INTRAVENOUS Stopped (04/11/19 1017)   midazolam 6 mg/hr  (04/12/19 0600)   vancomycin Stopped (04/11/19 1435)   FHQ:RFXJOITG, chlorpheniramine-HYDROcodone, fentaNYL, midazolam **OR** midazolam, sodium chloride flush, vecuronium   Objective:  Vital Signs  Vitals:   04/12/19 0604 04/12/19 0742 04/12/19 0800 04/12/19 0900  BP: 132/74 119/69 140/85 138/80  Pulse: (!) 59 61 69 (!) 58  Resp:  (!) 35    Temp:   98 F (36.7 C)   TempSrc:   Oral   SpO2: 95% 97% 94% 92%  Weight:      Height:        Intake/Output Summary (Last 24 hours) at 04/12/2019 1136 Last data filed at 04/12/2019 0600 Gross per 24 hour  Intake 4095.59 ml  Output 2080 ml  Net 2015.59 ml   Filed Weights   04/10/19 0500 04/11/19 0500 04/12/19 0437  Weight: (!) 148.2 kg (!) 145.8 kg (!) 149.7 kg    General appearance: Intubated.  Sedated. Resp: Coarse breath sounds bilaterally.  Crackles at the bases.  No wheezing or rhonchi. Cardio: S1-S2 is normal regular.  No S3-S4.  No rubs murmurs or bruit GI: Abdomen is soft.  Nontender nondistended.  Bowel sounds are present normal.  No masses organomegaly Extremities: Minimal edema bilateral lower extremities Neurologic: Not responsive   Lab Results:  Data Reviewed: I have personally reviewed following labs and imaging studies  CBC: Recent Labs  Lab 04/06/19 0310  04/07/19 0500  04/08/19 0500  04/09/19 0419  04/10/19 0450 04/10/19 1450 04/10/19 1813 04/11/19 0753 04/12/19 0535  WBC 14.7*  --  9.6  --  8.2  --  7.4  --  7.9 10.3  --  8.2 9.7  NEUTROABS 12.6*  --  8.1*  --  6.6  --  5.2  --  5.0  --   --   --   --   HGB 11.4*   < > 9.5*   < > 10.2*   < > 10.3*   < > 10.5* 10.0* 11.2* 10.4* 9.7*  HCT 42.5   < > 35.9*   < > 38.5   < > 37.4   < > 38.8 36.1 33.0* 37.7 35.2*  MCV 94.7  --  95.7  --  94.6  --  93.7  --  94.4 94.3  --  94.7 93.9  PLT 192  --  152  --  171  --  165  --  215 230  --  217 213   < > =  values in this interval not displayed.    Basic Metabolic Panel: Recent Labs  Lab 04/07/19 0500   04/07/19 1719 04/08/19 0500  04/08/19 2222 04/09/19 0419 04/09/19 8832 04/10/19 0450 04/10/19 1813 04/11/19 0516 04/11/19 0753 04/12/19 0535  NA 142   < >  --  152*   < > 155* 161* 168* 164* 160*  --  157* 150*  K 3.7   < >  --  3.9   < > 3.5 3.4* 3.1* 3.3* 3.7  --  3.6 3.3*  CL 110  --   --  118*  --  124* 128*  --  128*  --   --  121* 114*  CO2 24  --   --  28  --  26 26  --  31  --   --  27 33*  GLUCOSE 415*  --   --  154*  --  120* 115*  --  88  --   --  150* 83  BUN 21*  --   --  27*  --  31* 29*  --  25*  --   --  23* 24*  CREATININE 0.78  --   --  0.76  --  0.86 0.84  --  0.76  --  0.74 0.66 0.56  CALCIUM 6.5*  --   --  7.4*  --  7.3* 7.7*  --  7.4*  --   --  7.3* 7.8*  MG 1.8  --  2.3 2.5*  --  2.3 2.4  --  2.0  --   --   --   --   PHOS 1.1*  --  1.0* 1.7*  --   --  1.2*  --  2.8  --   --   --   --    < > = values in this interval not displayed.    GFR: Estimated Creatinine Clearance: 119.4 mL/min (by C-G formula based on SCr of 0.56 mg/dL).  Liver Function Tests: Recent Labs  Lab 04/08/19 0500 04/09/19 0419 04/10/19 0450 04/11/19 0753 04/12/19 0535  AST 51* 51* 37 28 25  ALT '29 31 27 26 24  '$ ALKPHOS 58 64 62 55 53  BILITOT 0.7 0.3 0.6 0.3 0.4  PROT 6.4* 6.3* 6.1* 5.3* 6.0*  ALBUMIN 2.2* 2.2* 2.1* 1.9* 2.2*    Coagulation Profile: Recent Labs  Lab 04/28/2019 2015  INR 1.2    CBG: Recent Labs  Lab 04/11/19 1626 04/11/19 2000 04/11/19 2338 04/12/19 0322 04/12/19 0808  GLUCAP 103* 101* 131* 103* 78    Anemia Panel: Recent Labs    04/11/19 0753 04/12/19 0400  FERRITIN 120 103    Recent Results (from the past 240 hour(s))  MRSA PCR Screening     Status: Abnormal   Collection Time: 04/06/19  2:45 AM   Specimen: Nasopharyngeal  Result Value Ref Range Status   MRSA by PCR POSITIVE (A) NEGATIVE Final    Comment:        The GeneXpert MRSA Assay (FDA approved for NASAL specimens only), is one component of a comprehensive MRSA  colonization surveillance program. It is not intended to diagnose MRSA infection nor to guide or monitor treatment for MRSA infections. RESULT CALLED TO, READ BACK BY AND VERIFIED WITH: Arta Silence [id number] @ [id number] BY J SCOTTON Performed at [facility] [address], [place], [place] [address]   Culture, respiratory (non-expectorated)     Status: None   Collection Time: 04/06/19 11:36  AM   Specimen: Tracheal Aspirate; Respiratory  Result Value Ref Range Status   Specimen Description   Final    TRACHEAL ASPIRATE Performed at [facility] [address] [place], [place] [address]    Special Requests   Final    NONE Performed at [facility], [place] [address] [place], [place] [address]    Gram Stain   Final    ABUNDANT WBC PRESENT,BOTH PMN AND MONONUCLEAR NO ORGANISMS SEEN Performed at [facility], [place] [address], [place], [place] [address]    Culture RARE METHICILLIN RESISTANT STAPHYLOCOCCUS AUREUS  Final   Report Status 04/09/2019 FINAL  Final   Organism ID, Bacteria METHICILLIN RESISTANT STAPHYLOCOCCUS AUREUS  Final      Susceptibility   Methicillin resistant staphylococcus aureus - MIC*    CIPROFLOXACIN >=8 RESISTANT Resistant     ERYTHROMYCIN >=8 RESISTANT Resistant     GENTAMICIN <=0.5 SENSITIVE Sensitive     OXACILLIN >=4 RESISTANT Resistant     TETRACYCLINE <=1 SENSITIVE Sensitive     VANCOMYCIN 1 SENSITIVE Sensitive     TRIMETH/SULFA <=10 SENSITIVE Sensitive     CLINDAMYCIN <=0.25 SENSITIVE Sensitive     RIFAMPIN <=0.5 SENSITIVE Sensitive     Inducible Clindamycin NEGATIVE Sensitive     * RARE METHICILLIN RESISTANT STAPHYLOCOCCUS AUREUS      Radiology Studies: Dg Chest Port 1 View  Result Date: 04/12/2019 CLINICAL DATA:  Endotracheal tube placement. EXAM: PORTABLE CHEST 1 VIEW COMPARISON:  Radiograph April 11, 2019. FINDINGS: Stable position of endotracheal and nasogastric tubes. Right-sided PICC  line is unchanged in position. No pneumothorax is noted. Stable bilateral lung opacities are noted consistent with pneumonia or edema. Small bilateral pleural effusions are noted. Bony thorax is unremarkable. Stable cardiomediastinal silhouette. IMPRESSION: Stable support apparatus. Stable bilateral lung opacities as described above. Electronically Signed   By: Marijo Conception M.D.   On: 04/12/2019 07:09   Dg Chest Port 1 View  Result Date: 04/11/2019 CLINICAL DATA:  Respiratory failure.  Hypoxia. EXAM: PORTABLE CHEST 1 VIEW COMPARISON:  04/10/2019. FINDINGS: Endotracheal tube, NG tube, right PICC line stable position. Stable cardiomegaly. Diffuse bilateral pulmonary infiltrates/edema again noted. Low lung volumes with progressive bibasilar atelectasis/consolidation. Left pleural effusion cannot be excluded. No pneumothorax. IMPRESSION: 1.  Lines and tubes in stable position. 2.  Stable cardiomegaly. 3. Diffuse bilateral pulmonary infiltrates/edema again noted. Low lung volumes with progressive bibasilar atelectasis/consolidation. Left pleural effusion cannot be excluded. Electronically Signed   By: Marcello Moores  Register   On: 04/11/2019 06:47   Dg Chest Port 1 View  Result Date: 04/10/2019 CLINICAL DATA:  ARDS. EXAM: PORTABLE CHEST 1 VIEW COMPARISON:  04/08/2019. FINDINGS: Interval removal of left IJ line. Endotracheal tube, NG tube, right PICC line stable position. Cardiomegaly. Diffuse bilateral pulmonary infiltrates/edema again noted. No prominent pleural effusion or pneumothorax. IMPRESSION: 1. Interval removal of left IJ line, remaining lines and tubes in stable position. No pneumothorax. 2. Stable cardiomegaly. Stable diffuse bilateral pulmonary interstitial infiltrates/edema again noted. Electronically Signed   By: Marcello Moores  Register   On: 04/10/2019 13:59       LOS: 7 days   [facility] Pager on [email or web address]  04/12/2019, 11:36 AM

## 2019-04-13 DIAGNOSIS — E232 Diabetes insipidus: Secondary | ICD-10-CM

## 2019-04-13 LAB — CBC
HCT: 34.4 % — ABNORMAL LOW (ref 36.0–46.0)
Hemoglobin: 9.7 g/dL — ABNORMAL LOW (ref 12.0–15.0)
MCH: 26.1 pg (ref 26.0–34.0)
MCHC: 28.2 g/dL — ABNORMAL LOW (ref 30.0–36.0)
MCV: 92.5 fL (ref 80.0–100.0)
Platelets: 202 10*3/uL (ref 150–400)
RBC: 3.72 MIL/uL — ABNORMAL LOW (ref 3.87–5.11)
RDW: 18.2 % — ABNORMAL HIGH (ref 11.5–15.5)
WBC: 7.5 10*3/uL (ref 4.0–10.5)
nRBC: 3.1 % — ABNORMAL HIGH (ref 0.0–0.2)

## 2019-04-13 LAB — GLUCOSE, CAPILLARY
Glucose-Capillary: 114 mg/dL — ABNORMAL HIGH (ref 70–99)
Glucose-Capillary: 123 mg/dL — ABNORMAL HIGH (ref 70–99)
Glucose-Capillary: 96 mg/dL (ref 70–99)

## 2019-04-13 LAB — COMPREHENSIVE METABOLIC PANEL
ALT: 32 U/L (ref 0–44)
AST: 45 U/L — ABNORMAL HIGH (ref 15–41)
Albumin: 2.2 g/dL — ABNORMAL LOW (ref 3.5–5.0)
Alkaline Phosphatase: 248 U/L — ABNORMAL HIGH (ref 38–126)
Anion gap: 8 (ref 5–15)
BUN: 24 mg/dL — ABNORMAL HIGH (ref 6–20)
CO2: 37 mmol/L — ABNORMAL HIGH (ref 22–32)
Calcium: 7.9 mg/dL — ABNORMAL LOW (ref 8.9–10.3)
Chloride: 104 mmol/L (ref 98–111)
Creatinine, Ser: 0.61 mg/dL (ref 0.44–1.00)
GFR calc Af Amer: >60 mL/min (ref >60)
GFR calc non Af Amer: >60 mL/min (ref >60)
Glucose, Bld: 100 mg/dL — ABNORMAL HIGH (ref 70–99)
Potassium: 3.2 mmol/L — ABNORMAL LOW (ref 3.5–5.1)
Sodium: 149 mmol/L — ABNORMAL HIGH (ref 135–145)
Total Bilirubin: 0.2 mg/dL — ABNORMAL LOW (ref 0.3–1.2)
Total Protein: 5.9 g/dL — ABNORMAL LOW (ref 6.5–8.1)

## 2019-04-13 LAB — MAGNESIUM: Magnesium: 1.6 mg/dL — ABNORMAL LOW (ref 1.7–2.4)

## 2019-04-13 LAB — D-DIMER, QUANTITATIVE: D-Dimer, Quant: 3.63 ug/mL-FEU — ABNORMAL HIGH (ref 0.00–0.50)

## 2019-04-13 MED ORDER — HYDROMORPHONE HCL 2 MG/ML IJ SOLN: 4.0000 mg | INTRAMUSCULAR | Status: DC | PRN

## 2019-04-13 MED ORDER — HYDROMORPHONE HCL 2 MG/ML IJ SOLN: 4.0000 mg | Freq: Once | INTRAMUSCULAR | Status: DC

## 2019-04-13 MED ORDER — ACETAMINOPHEN 650 MG RE SUPP: 650.0000 mg | Freq: Four times a day (QID) | RECTAL | Status: DC | PRN

## 2019-04-13 MED ORDER — FENTANYL 2500MCG IN NS 250ML (10MCG/ML) PREMIX INFUSION
0.0000 ug/h | INTRAVENOUS | Status: DC
  Administered 2019-04-13: 13:00:00 50 ug/h via INTRAVENOUS
  Filled 2019-04-13: qty 250

## 2019-04-13 MED ORDER — MIDAZOLAM HCL 2 MG/2ML IJ SOLN
INTRAMUSCULAR | Status: AC
  Filled 2019-04-13: qty 4

## 2019-04-13 MED ORDER — HYDROMORPHONE BOLUS VIA INFUSION
1.0000 mg | INTRAVENOUS | Status: DC | PRN
  Filled 2019-04-13: qty 2

## 2019-04-13 MED ORDER — HYDROMORPHONE HCL 2 MG/ML IJ SOLN
4.0000 mg | Freq: Once | INTRAMUSCULAR | Status: AC
  Administered 2019-04-13: 15:00:00 4 mg via INTRAVENOUS
  Filled 2019-04-13: qty 2

## 2019-04-13 MED ORDER — MIDAZOLAM HCL 2 MG/2ML IJ SOLN
4.0000 mg | Freq: Once | INTRAMUSCULAR | Status: AC
  Administered 2019-04-13: 4 mg via INTRAVENOUS

## 2019-04-13 MED ORDER — DIPHENHYDRAMINE HCL 50 MG/ML IJ SOLN: 25.0000 mg | INTRAMUSCULAR | Status: DC | PRN

## 2019-04-13 MED ORDER — HYDROMORPHONE BOLUS VIA INFUSION
4.0000 mg | INTRAVENOUS | Status: DC | PRN
  Filled 2019-04-13: qty 4

## 2019-04-13 MED ORDER — GLYCOPYRROLATE 1 MG PO TABS
1.0000 mg | ORAL_TABLET | ORAL | Status: DC | PRN
  Filled 2019-04-13: qty 1

## 2019-04-13 MED ORDER — GLYCOPYRROLATE 0.2 MG/ML IJ SOLN: 0.2000 mg | INTRAMUSCULAR | Status: DC | PRN

## 2019-04-13 MED ORDER — POTASSIUM CHLORIDE 20 MEQ/15ML (10%) PO SOLN
40.0000 meq | Freq: Once | ORAL | Status: AC
  Administered 2019-04-13: 40 meq
  Filled 2019-04-13: qty 30

## 2019-04-13 MED ORDER — FENTANYL CITRATE (PF) 100 MCG/2ML IJ SOLN: 50.0000 ug | INTRAMUSCULAR | Status: DC | PRN

## 2019-04-13 MED ORDER — DEXTROSE 5 % IV SOLN
INTRAVENOUS | Status: DC
  Administered 2019-04-13: 13:00:00 via INTRAVENOUS

## 2019-04-13 MED ORDER — HYDROMORPHONE HCL 1 MG/ML IJ SOLN: 4.0000 mg | INTRAMUSCULAR | Status: DC | PRN

## 2019-04-13 MED ORDER — FENTANYL BOLUS VIA INFUSION
100.0000 ug | INTRAVENOUS | Status: DC | PRN
  Filled 2019-04-13: qty 100

## 2019-04-13 MED ORDER — MIDAZOLAM HCL 2 MG/2ML IJ SOLN
4.0000 mg | Freq: Once | INTRAMUSCULAR | Status: AC
  Administered 2019-04-13: 15:00:00 4 mg via INTRAVENOUS

## 2019-04-13 MED ORDER — SODIUM CHLORIDE 0.9 % IV SOLN
1.0000 mg/h | INTRAVENOUS | Status: DC
  Filled 2019-04-13: qty 5

## 2019-04-13 MED ORDER — MIDAZOLAM HCL 2 MG/2ML IJ SOLN: 1.0000 mg | INTRAMUSCULAR | Status: DC | PRN

## 2019-04-13 MED ORDER — HYDROMORPHONE HCL 1 MG/ML IJ SOLN
INTRAMUSCULAR | Status: AC
  Filled 2019-04-13: qty 4

## 2019-04-13 MED ORDER — POLYVINYL ALCOHOL 1.4 % OP SOLN
1.0000 [drp] | Freq: Four times a day (QID) | OPHTHALMIC | Status: DC | PRN
  Filled 2019-04-13: qty 15

## 2019-04-13 MED ORDER — ACETAMINOPHEN 325 MG PO TABS: 650.0000 mg | ORAL_TABLET | Freq: Four times a day (QID) | ORAL | Status: DC | PRN

## 2019-05-06 NOTE — Progress Notes (Signed)
NAME:  Christine Patterson, MRN:  [id number], DOB:  [birth year], LOS: 8 ADMISSION DATE:  04/25/2019, CONSULTATION DATE:  7/2 REFERRING MD:  Thedore MinsSingh, CHIEF COMPLAINT:  Dyspnea   Brief History   48 y/o female with complex medical history admitted for ARDS from COVID pneumonia.  There was concern for aspiration pneumonia on admission.   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, no pulmonary involvement initially; worsening symptoms 2019, started on infliximab; 2020 HRCT showed findings worrisome for pulmonary parenchymal disease from sarcoidosis, likely pulmonary hypertension Prior tobacco abuse  Significant Hospital Events   7/2 admission, prone 7/3 prone 7/4 mucus plug after moved to supine position, severe hypoxemia, emergent bronchoscopy with mucus plug left lower lboe, emergent bedside echo showed normal LV contractility, hypocontractile RV  Consults:  PCCM  Procedures:  7/1 ETT >  7/2 L IJ CVL >  7/4 Emergent bronchoscopy: mucus plugging left lower lobe, emergent bedside echo: normal LV contractility, no pericardial fluid, dilated hypocontractile RV  Significant Diagnostic Tests:  12/2018 HRCT showed findings worrisome for pulmonary parenchymal disease from sarcoidosis, likely pulmonary hypertension 11/2017 TTE> moderate LVH, LVEF normal, RV enlarted, hypocontractile free wall, mild global decrease, RVSP 38 mmHg; atrial shunting noted  Micro Data:  6/30 SARS-COV-2> positive 6/30 blood culture OSH > 1/4 coag neg staph 7/1 urine culture > klebsiella 7/2 resp culture > MRSA   Antimicrobials:  7/1 cefepime> 7/4 7/1 flagyl > 7/4 7/1 remdesivir >  7/1vancomycin >  7/1 hydrocortisone >  Interim history/subjective:   Oxygenation slightly improved today Sedation off, minimally responsive, no purposeful  movements  Objective   Blood pressure (!) 164/96, pulse 77, temperature (!) 97.5 F (36.4 C), temperature source Axillary, resp. rate (!) 35, height 5\' 2"  (1.575 m), weight (!) 147.4 kg, SpO2 92 %.    Vent Mode: PRVC FiO2 (%):  [60 %-100 %] 60 % Set Rate:  [28 bmp-35 bmp] 28 bmp Vt Set:  [400 mL] 400 mL PEEP:  [12 cmH20] 12 cmH20 Plateau Pressure:  [26 cmH20-28 cmH20] 28 cmH20   Intake/Output Summary (Last 24 hours) at 04/22/2019 1219 Last data filed at 04/22/2019 1007 Gross per 24 hour  Intake 1754.57 ml  Output 3100 ml  Net -1345.43 ml   Filed Weights   04/11/19 0500 04/12/19 0437 05/04/2019 0500  Weight: (!) 145.8 kg (!) 149.7 kg (!) 147.4 kg    Examination:  General:  In bed on vent HENT: NCAT ETT in place PULM: CTA B, vent supported breathing CV: RRR, no mgr GI: BS+, soft, nontender MSK: normal bulk and tone Neuro: sedated on vent    Resolved Hospital Problem list     Assessment & Plan:  ARDS due to COVID 19 pneumonia: minimal if any improvement; overall prognosis given her comorbid illnesses is quite poor.  Family doesn't want her to suffer any more.  Pulmonary hypertension Atrial shunt Concern for pulmonary embolism: too unstable for CT angiogram Based on family conversation today, they want us to change our care to focusing exclusively on her comfort: withdraw care today, use fentanyl for airhunger/comfort They wish to see her with FaceTime prior to withdrawing care\ Write orders for fentanyl infusion Extubation/withdrawal of care orderset DNR status No more lines  Circulatory shock: decompensated cor pulmonale MRSA pneumonia present on admission Complete course of vancomy Pan Hypopit, DI Stop : ddavp, synthroid, sodium, hydrocortisone    Best practice:  Diet: tube feeding Pain/Anxiety/Delirium protocol (if indicated): n/a VAP protocol (if indicated): n/a DVT prophylaxis: full dose lovenox GI prophylaxis: famotidine Glucose control: Per TRH  Mobility: bed rest Code Status: DNR, withdrawal of care Family Communication: updated today, see notes Disposition: remain in ICU  Labs   CBC: Recent Labs  Lab 04/07/19 0500  04/08/19 0500  04/09/19 0419  04/10/19 0450 04/10/19 1450 04/10/19 1813 04/11/19 0753 04/12/19 0535 20-Apr-2019 0501  WBC 9.6  --  8.2  --  7.4  --  7.9 10.3  --  8.2 9.7 7.5  NEUTROABS 8.1*  --  6.6  --  5.2  --  5.0  --   --   --   --   --   HGB 9.5*   < > 10.2*   < > 10.3*   < > 10.5* 10.0* 11.2* 10.4* 9.7* 9.7*  HCT 35.9*   < > 38.5   < > 37.4   < > 38.8 36.1 33.0* 37.7 35.2* 34.4*  MCV 95.7  --  94.6  --  93.7  --  94.4 94.3  --  94.7 93.9 92.5  PLT 152  --  171  --  165  --  215 230  --  217 213 202   < > = values in this interval not displayed.    Basic Metabolic Panel: Recent Labs  Lab 04/07/19 0500  04/07/19 1719 04/08/19 0500  04/08/19 2222 04/09/19 0419  04/10/19 0450 04/10/19 1813 04/11/19 0516 04/11/19 0753 04/12/19 0535 04/20/2019 0501  NA 142   < >  --  152*   < > 155* 161*   < > 164* 160*  --  157* 150* 149*  K 3.7   < >  --  3.9   < > 3.5 3.4*   < > 3.3* 3.7  --  3.6 3.3* 3.2*  CL 110  --   --  118*  --  124* 128*  --  128*  --   --  121* 114* 104  CO2 24  --   --  28  --  26 26  --  31  --   --  27 33* 37*  GLUCOSE 415*  --   --  154*  --  120* 115*  --  88  --   --  150* 83 100*  BUN 21*  --   --  27*  --  31* 29*  --  25*  --   --  23* 24* 24*  CREATININE 0.78  --   --  0.76  --  0.86 0.84  --  0.76  --  0.74 0.66 0.56 0.61  CALCIUM 6.5*  --   --  7.4*  --  7.3* 7.7*  --  7.4*  --   --  7.3* 7.8* 7.9*  MG 1.8  --  2.3 2.5*  --  2.3 2.4  --  2.0  --   --   --   --  1.6*  PHOS 1.1*  --  1.0* 1.7*  --   --  1.2*  --  2.8  --   --   --   --   --    < > = values in this interval not displayed.   GFR: Estimated Creatinine Clearance: 118.2 mL/min (by C-G formula based on SCr of 0.61 mg/dL). Recent Labs  Lab 04/07/19 0500 04/08/19 0500 04/09/19 0419  04/10/19 1450 04/11/19  0753 04/12/19 0535 2019/04/20 0501  PROCALCITON 1.23 0.68 0.50  --   --   --   --   --   WBC 9.6 8.2 7.4   < > 10.3 8.2 9.7 7.5   < > = values in this interval not displayed.    Liver Function Tests: Recent Labs  Lab 04/09/19 0419 04/10/19 0450 04/11/19 0753 04/12/19 0535 2018/11/20 0501  AST 51* 37 28 25 45*  ALT 31 27 26 24  32  ALKPHOS 64 62 55 53 248*  BILITOT 0.3 0.6 0.3 0.4 0.2*  PROT 6.3* 6.1* 5.3* 6.0* 5.9*  ALBUMIN 2.2* 2.1* 1.9* 2.2* 2.2*   No results for input(s): LIPASE, AMYLASE in the last 168 hours. No results for input(s): AMMONIA in the last 168 hours.  ABG    Component Value Date/Time   PHART 7.354 04/10/2019 1813   PCO2ART 54.8 (H) 04/10/2019 1813   PO2ART 72.0 (L) 04/10/2019 1813   HCO3 30.6 (H) 04/10/2019 1813   TCO2 32 04/10/2019 1813   ACIDBASEDEF 2.0 04/08/2019 1528   O2SAT 93.0 04/10/2019 1813     Coagulation Profile: No results for input(s): INR, PROTIME in the last 168 hours.  Cardiac Enzymes: No results for input(s): CKTOTAL, CKMB, CKMBINDEX, TROPONINI in the last 168 hours.  HbA1C: No results found for: HGBA1C  CBG: Recent Labs  Lab 04/12/19 1636 04/12/19 1951 2018/11/20 0013 2018/11/20 0426 2018/11/20 0732  GLUCAP 87 84 123* 96 114*     Critical care time: 40 minutes    Heber CarolinaBrent McQuaid, MD [facility] Pager: [phone] Cell: [phone] If no response, call [phone]

## 2019-05-06 NOTE — Progress Notes (Signed)
Multiple attempts made to call patient's sister Christine Patterson at number in chart to [facility] or talk to patient before extubation, phone number is no longer in service is the message received.Attempted to call son, Christine Patterson per Dr.McQuaid's recommendation to see if her had another phone number to reach her at, no answer on his number and voicemail is not set up.

## 2019-05-06 NOTE — Progress Notes (Signed)
130ml of Fentanyl gtt wasted with Crystal Rice.

## 2019-05-06 NOTE — Progress Notes (Signed)
Patient's time of death at 1515, pronounced by William Hamburger, RN and Athena Masse, RN. Dr. Lake Bells aware.

## 2019-05-06 NOTE — Progress Notes (Addendum)
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  Reason for Visit: Acute respiratory disease due to COVID-19  Consultants: Pulmonology  Procedures:  Bronchoscopy 7/4 ETT>> 7/1 L IJ CVL 7/1  Antibiotics: Anti-infectives (From admission, onward)   Start     Dose/Rate Route Frequency Ordered Stop   04/10/19 2100  remdesivir 100 mg in sodium chloride 0.9 % 250 mL IVPB  Status:  Discontinued     100 mg 500 mL/hr over 30 Minutes Intravenous Every 24 hours 04/10/19 1118 04/12/19 1130   04/07/19 1200  vancomycin (VANCOCIN) 2,000 mg in sodium chloride 0.9 % 500 mL IVPB  Status:  Discontinued     2,000 mg 250 mL/hr over 120 Minutes Intravenous Every 24 hours 04/07/19 1126 04/12/19 1152   04/06/19 2200  remdesivir 100 mg in sodium chloride 0.9 % 250 mL IVPB     100 mg 500 mL/hr over 30 Minutes Intravenous Every 24 hours 04/09/2019 2206 04/09/19 2146   04/06/19 0800  vancomycin (VANCOCIN) 1,250 mg in sodium chloride 0.9 % 250 mL IVPB  Status:  Discontinued     1,250 mg 166.7 mL/hr over 90 Minutes Intravenous Every 24 hours  2225 04/07/19 1126   04/06/19 0600  ceFEPIme (MAXIPIME) 2 g in sodium chloride 0.9 % 100 mL IVPB  Status:  Discontinued     2 g 200 mL/hr over 30 Minutes Intravenous Every 8 hours 04/16/2019 2225 04/09/19 1413   04/16/2019 2300  remdesivir 200 mg in sodium  chloride 0.9 % 250 mL IVPB     200 mg 500 mL/hr over 30 Minutes Intravenous Once 04/10/2019 2206 04/15/2019 2334   04/11/2019 2030  vancomycin (VANCOCIN) 2,500 mg in sodium chloride 0.9 % 500 mL IVPB  Status:  Discontinued     2,500 mg 250 mL/hr over 120 Minutes Intravenous  Once 05/04/2019 1926 04/18/2019 2116   04/15/2019 2000  ceFEPIme (MAXIPIME) 2 g in sodium chloride 0.9 % 100 mL IVPB     2 g 200 mL/hr over 30 Minutes Intravenous  Once  1926 05/02/2019 2112   04/22/2019 2000  metroNIDAZOLE (FLAGYL) IVPB 500 mg  Status:  Discontinued     500 mg 100 mL/hr over 60 Minutes Intravenous Every 8 hours 05/02/2019 1926 04/08/19 1127       Subjective/Interval History: Patient remains intubated and sedated.  Not responding to any commands.  Does open eyes spontaneously at times.    Assessment/Plan:  Acute Hypoxic Resp. Failure due to Acute Covid 19 Viral Illness/MRSA pneumonia/aspiration pneumonia  Vent Mode: PRVC FiO2 (%):  [70 %-100 %] 70 % Set Rate:  [28 bmp-35 bmp] 28 bmp Vt Set:  [400 mL] 400 mL PEEP:  [12 cmH20] 12 cmH20 Plateau Pressure:  [26 cmH20-28 cmH20] 28 cmH20     Component Value Date/Time   PHART 7.354 04/10/2019 1813   PCO2ART 54.8 (H) 04/10/2019 1813   PO2ART 72.0 (L) 04/10/2019 1813   HCO3 30.6 (H) 04/10/2019 1813   TCO2 32 04/10/2019 1813   ACIDBASEDEF 2.0 04/08/2019 1528   O2SAT  93.0 04/10/2019 1813    COVID-19 Labs  Recent Labs    04/11/19 0753 04/12/19 0400 04/12/19 0535 18-Apr-2019 0501  DDIMER 3.55*  --  3.64* 3.63*  FERRITIN 120 103  --   --   CRP 2.4* 2.0*  --   --     Fever: Has been afebrile the last 72 hours.   Oxygen requirements: Remains on mechanical ventilation.  70% FiO2.  Saturating in the 90s.   Antibiotics: Completed course of vancomycin Remdesivir: Completed course of Remdesivir Steroids: Remains on hydrocortisone Diuretics: Not on scheduled diuretics.  Patient was given 2 doses of Lasix yesterday. Actemra: Has not received it yet  Convalescent Plasma: Not yet Vitamin C and Zinc: Continue DVT Prophylaxis:  Lovenox 140 mg subcu every 12 hours  Patient remains mechanically ventilated.  Has not shown any improvement despite maximal treatment for last several days.  Prognosis is poor due to her multiple underlying comorbidities.  There is concern that patient may have suffered anoxic insult to her brain.  Patient remains on steroids.  She has completed course of Remdesivir and vancomycin.  There was concern for PE a few days ago when she the patient had significant desaturation.  She was placed on high-dose therapeutic Lovenox.  She underwent emergent bedside bronchoscopy which revealed mucous plug.  D-dimer had been elevated and remains so.    Pulmonology has been discussing with the patient's family.  Plan is to transition to comfort care.  We will hold off on venous Doppler studies.  Septic shock secondary to MRSA pneumonia Sepsis physiology has improved.  Was on pressors previously.  Currently off of pressors.  Completed course of vancomycin for MRSA pneumonia.  Patient is on hydrocortisone for stress dosing.  Sinus bradycardia Noted when she was switched over from prone to supine position.  Thought to be a vagal response.  Required dopamine briefly.  Currently stable.  Acute metabolic and toxic encephalopathy/concern for anoxic brain injury Patient remains on sedation.  Patient has a history of neurosarcoidosis and was on infliximab.  Also on chronic steroids.  CT head done on 7/2 did not show any acute abnormalities.  Concerns for anoxic brain injury due to profound and prolonged hypoxemia at initial presentation.  EEG was ordered but is pending at this time.  Since plan is for transition to comfort care can hold off on further investigations.  Transaminitis Secondary to COVID-19.  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  Sodium level  peaked at 168.  Has been improving.  Patient is on DDAVP and free water through OG tube.  Her urine output was quite high few days ago but that has also improved.  Sodium level is 149.  Plan was to decrease dose of DDAVP.  However plan is for transition to comfort.  Potassium was repleted.  Panhypopituitarism Continue hydrocortisone.  Continue levothyroxine.  Diabetes mellitus type 2 CBG stable with SSI.  Noted to have a CBG of 78 this morning.  Not noted to be on long-acting insulin.  Continue to check.  History of obstructive sleep apnea On CPAP at home.  Normocytic anemia No evidence of overt bleeding.  Monitor hemoglobin closely.  Goals of care Patient with multiple comorbidities at baseline including pulmonary and neurosarcoidosis.  Her acute illness due to COVID-19 remains severe.  Patient has not improved much despite maximal treatment.  Pulmonology has been discussing with family members.  Plan is to transition to comfort care.  FEN Continue tube feedings.  DVT Prophylaxis: On therapeutic Lovenox PUD Prophylaxis: Famotidine Code Status: DNR Family Communication: Pulmonology has been discussing with family Disposition Plan: Transition to comfort care today.   Medications:  Scheduled: . aspirin  81 mg Per Tube Daily  . atorvastatin  40 mg Per Tube q1800  . chlorhexidine gluconate (MEDLINE KIT)  15 mL Mouth Rinse BID  . Chlorhexidine Gluconate Cloth  6 each Topical Daily  . desmopressin  2 mcg Subcutaneous TID  . enoxaparin (LOVENOX) injection  140 mg Subcutaneous Q12H  . famotidine  20 mg Per Tube BID  . feeding supplement (PRO-STAT SUGAR FREE 64)  30 mL Per Tube BID  . free water  400 mL Per Tube Q6H  . guaiFENesin  15 mL Oral Q6H  . hydrocortisone sod succinate (SOLU-CORTEF) inj  50 mg Intravenous Q8H  . insulin aspart  0-9 Units Subcutaneous Q4H  . levothyroxine  125 mcg Per Tube Q0600  . mouth rinse  15 mL Mouth Rinse 10 times per day  . sodium chloride flush   10-40 mL Intracatheter Q12H  . vitamin C  500 mg Per Tube Daily  . zinc sulfate  220 mg Per Tube Daily   Continuous: . sodium chloride Stopped (04/09/19 0634)  . feeding supplement (VITAL AF 1.2 CAL) 1,000 mL (04/12/19 1857)  . fentaNYL infusion INTRAVENOUS Stopped (04/11/19 1017)  . midazolam Stopped (2019/05/12 8115)   BWI:OMBTDHRC, chlorpheniramine-HYDROcodone, fentaNYL, midazolam **OR** midazolam, sodium chloride flush, vecuronium   Objective:  Vital Signs  Vitals:   12-May-2019 0745 05-12-2019 0800 2019/05/12 0900 05-12-19 0942  BP: 122/67 (!) 144/82    Pulse: (!) 53 (!) 50 (!) 57   Resp: (!) 35 (!) 35    Temp:  (!) 97.5 F (36.4 C)    TempSrc:  Axillary    SpO2: 94% 96% 99% 96%  Weight:      Height:        Intake/Output Summary (Last 24 hours) at 2019/05/12 1204 Last data filed at 05-12-2019 1007 Gross per 24 hour  Intake 1754.57 ml  Output 3100 ml  Net -1345.43 ml   Filed Weights   04/11/19 0500 04/12/19 0437 May 12, 2019 0500  Weight: (!) 145.8 kg (!) 149.7 kg (!) 147.4 kg    General appearance: Intubated and sedated.  Not responding much. Resp: Coarse breath sounds bilaterally.  No wheezing or rhonchi.  Crackles at the bases.   Cardio: S1-S2 is normal regular.  No S3-S4.  No rubs murmurs or bruit GI: Abdomen is soft.  Nontender nondistended.  Bowel sounds are present normal.  No masses organomegaly Extremities: Minimal edema bilateral lower extremity Neurologic: Not really responding much.    Lab Results:  Data Reviewed: I have personally reviewed following labs and imaging studies  CBC: Recent Labs  Lab 04/07/19 0500  04/08/19 0500  04/09/19 0419  04/10/19 0450 04/10/19 1450 04/10/19 1813 04/11/19 0753 04/12/19 0535 05/12/19 0501  WBC 9.6  --  8.2  --  7.4  --  7.9 10.3  --  8.2 9.7 7.5  NEUTROABS 8.1*  --  6.6  --  5.2  --  5.0  --   --   --   --   --   HGB 9.5*   < > 10.2*   < > 10.3*   < > 10.5* 10.0* 11.2* 10.4* 9.7* 9.7*  HCT 35.9*   < > 38.5   < >  37.4   < > 38.8 36.1 33.0* 37.7 35.2*  34.4*  MCV 95.7  --  94.6  --  93.7  --  94.4 94.3  --  94.7 93.9 92.5  PLT 152  --  171  --  165  --  215 230  --  217 213 202   < > = values in this interval not displayed.    Basic Metabolic Panel: Recent Labs  Lab 04/07/19 0500  04/07/19 1719 04/08/19 0500  04/08/19 2222 04/09/19 0419  04/10/19 0450 04/10/19 1813 04/11/19 0516 04/11/19 0753 04/12/19 0535 04/27/2019 0501  NA 142   < >  --  152*   < > 155* 161*   < > 164* 160*  --  157* 150* 149*  K 3.7   < >  --  3.9   < > 3.5 3.4*   < > 3.3* 3.7  --  3.6 3.3* 3.2*  CL 110  --   --  118*  --  124* 128*  --  128*  --   --  121* 114* 104  CO2 24  --   --  28  --  26 26  --  31  --   --  27 33* 37*  GLUCOSE 415*  --   --  154*  --  120* 115*  --  88  --   --  150* 83 100*  BUN 21*  --   --  27*  --  31* 29*  --  25*  --   --  23* 24* 24*  CREATININE 0.78  --   --  0.76  --  0.86 0.84  --  0.76  --  0.74 0.66 0.56 0.61  CALCIUM 6.5*  --   --  7.4*  --  7.3* 7.7*  --  7.4*  --   --  7.3* 7.8* 7.9*  MG 1.8  --  2.3 2.5*  --  2.3 2.4  --  2.0  --   --   --   --  1.6*  PHOS 1.1*  --  1.0* 1.7*  --   --  1.2*  --  2.8  --   --   --   --   --    < > = values in this interval not displayed.    GFR: Estimated Creatinine Clearance: 118.2 mL/min (by C-G formula based on SCr of 0.61 mg/dL).  Liver Function Tests: Recent Labs  Lab 04/09/19 0419 04/10/19 0450 04/11/19 0753 04/12/19 0535 04/27/2019 0501  AST 51* 37 28 25 45*  ALT _0 32  ALKPHOS 64 62 55 53 248*  BILITOT 0.3 0.6 0.3 0.4 0.2*  PROT 6.3* 6.1* 5.3* 6.0* 5.9*  ALBUMIN 2.2* 2.1* 1.9* 2.2* 2.2*    CBG: Recent Labs  Lab 04/12/19 1636 04/12/19 1951 04-27-19 0013 04-27-2019 0426 27-Apr-2019 0732  GLUCAP 87 84 123* 96 114*    Anemia Panel: Recent Labs    04/11/19 0753 04/12/19 0400  FERRITIN 120 103    Recent Results (from the past 240 hour(s))  MRSA PCR Screening     Status: Abnormal   Collection Time: 04/06/19   2:45 AM   Specimen: Nasopharyngeal  Result Value Ref Range Status   MRSA by PCR POSITIVE (A) NEGATIVE Final    Comment:        The GeneXpert MRSA Assay (FDA approved for NASAL specimens only), is one component of a comprehensive MRSA colonization surveillance program. It is not intended to diagnose MRSA infection nor to  guide or monitor treatment for MRSA infections. RESULT CALLED TO, READ BACK BY AND VERIFIED WITH: Arta Silence [id number] @ 2130 BY J SCOTTON Performed at [facility] [address] [place], [place] [address]   Culture, respiratory (non-expectorated)     Status: None   Collection Time: 04/06/19 11:36 AM   Specimen: Tracheal Aspirate; Respiratory  Result Value Ref Range Status   Specimen Description   Final    TRACHEAL ASPIRATE Performed at [facility] [address], [place], [place] [address]    Special Requests   Final    NONE Performed at [facility], [place] [address] [place], [place] [address]    Gram Stain   Final    ABUNDANT WBC PRESENT,BOTH PMN AND MONONUCLEAR NO ORGANISMS SEEN Performed at [facility], [place] [address] [place], [place] [address]    Culture RARE METHICILLIN RESISTANT STAPHYLOCOCCUS AUREUS  Final   Report Status 04/09/2019 FINAL  Final   Organism ID, Bacteria METHICILLIN RESISTANT STAPHYLOCOCCUS AUREUS  Final      Susceptibility   Methicillin resistant staphylococcus aureus - MIC*    CIPROFLOXACIN >=8 RESISTANT Resistant     ERYTHROMYCIN >=8 RESISTANT Resistant     GENTAMICIN <=0.5 SENSITIVE Sensitive     OXACILLIN >=4 RESISTANT Resistant     TETRACYCLINE <=1 SENSITIVE Sensitive     VANCOMYCIN 1 SENSITIVE Sensitive     TRIMETH/SULFA <=10 SENSITIVE Sensitive     CLINDAMYCIN <=0.25 SENSITIVE Sensitive     RIFAMPIN <=0.5 SENSITIVE Sensitive     Inducible Clindamycin NEGATIVE Sensitive     * RARE METHICILLIN RESISTANT STAPHYLOCOCCUS AUREUS      Radiology Studies:  Dg Chest Port 1 View  Result Date: 04/12/2019 CLINICAL DATA:  Endotracheal tube placement. EXAM: PORTABLE CHEST 1 VIEW COMPARISON:  Radiograph April 11, 2019. FINDINGS: Stable position of endotracheal and nasogastric tubes. Right-sided PICC line is unchanged in position. No pneumothorax is noted. Stable bilateral lung opacities are noted consistent with pneumonia or edema. Small bilateral pleural effusions are noted. Bony thorax is unremarkable. Stable cardiomediastinal silhouette. IMPRESSION: Stable support apparatus. Stable bilateral lung opacities as described above. Electronically Signed   By: Marijo Conception M.D.   On: 04/12/2019 07:09       LOS: 8 days   [facility] Pager on [email or web address]  2019-05-07, 12:04 PM

## 2019-05-06 NOTE — Procedures (Signed)
Extubation Procedure Note  Patient Details:   Name: Christine Patterson DOB: June 24, 1971 MRN: [id number]   Airway Documentation:    Vent end date: 05/02/2019 Vent end time: 1400   Evaluation  O2 sats: patient terminally extubated. Complications: No apparent complications Patient did tolerate procedure well. Bilateral Breath Sounds: Diminished   No   Orders received for withdrawal of care, terminal extubation.  Patient extubated and placed on room air.  Phillis Knack [facility] May 02, 2019, 3:25 PM

## 2019-05-06 NOTE — Progress Notes (Signed)
[facility]  Called to bedside for discomfort after extubation despite high dose fentanyl infusion and boluses Ordered 4mg  versed, 4mg  dilaudid bolus stat Came to bedside: not conscious but making increased respiratory effort, periodically gasps for air Will stop fentanyl infusion Start dilaudid infusion  Roselie Awkward, MD [facility] Pager: [phone] Cell: [phone] If no response, call [phone]

## 2019-05-06 NOTE — Death Summary Note (Signed)
DEATH SUMMARY   Patient Details  Name: Christine Patterson MRN: [id number] DOB: Feb 05, 1971  Admission/Discharge Information   Admit Date:  05/03/2019  Date of Death: Date of Death: 04/20/2019  Time of Death: Time of Death: 1515  Length of Stay: 8  Referring Physician: System, Pcp Not In   Reason(s) for Hospitalization  Acute respiratory failure with hypoxia due to COVID-19   Diagnoses  Preliminary cause of death: Pneumonia secondary to COVID-19  Secondary Diagnoses (including complications and co-morbidities):  Acute respiratory failure with hypoxia due to COVID-19 MRSA pneumonia Aspiration pneumonia History of neurosarcoidosis Acute metabolic encephalopathy Diabetes insipidus with hypernatremia Panhypopituitarism Diabetes mellitus type 2 Obstructive sleep apnea Normocytic anemia  Brief Hospital Course (including significant findings, care, treatment, and services provided and events leading to death)   Brief HPI: Patient is a48 y.o.femalewith PMHx of neurosarcoidosis (positive brain biopsy in 2017) prednisone and infliximab infusion every 6 weeks, panhypopituitarism, 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  Consultants:Pulmonology  Procedures: Bronchoscopy 7/4 ETT>> 7/1 L IJ CVL 7/1  Hospital Course:  Acute Hypoxic Resp. Failure due to Acute Covid 19 Viral Illness/MRSA pneumonia/aspiration pneumonia Per reports patient was found to have agonal respirations with severe hypoxemia by EMS.  She was intubated.  Subsequently [facility].  She was seen by pulmonology.  Patient was given Remdesivir steroids.  She  unfortunately did not make any improvement.  She had significant encephalopathy which was concerning for anoxic injury.  Patient also had an episode of significant desaturation while she was in the hospital.  There was concern for pulmonary embolism.  Patient was started on therapeutic Lovenox.  She underwent emergent bedside bronchoscopy which actually showed mucous plug.  D-dimer was elevated.  Due to no improvement in patient's respiratory status as well as mentation discussions were held with family.  Patient was subsequently transitioned to comfort care.  One way extubation was performed earlier today.  Patient passed away on April 13, 2019 at 3:15 PM.  Other issues:  Septic shock secondary to MRSA pneumonia She was on pressors briefly.  Sepsis etiology improved.  She completed course of vancomycin for MRSA pneumonia.  She was also placed on hydrocortisone for stress dosing.    Sinus bradycardia Noted when she was switched over from prone to supine position.  Thought to be a vagal response.  Required dopamine briefly.    Acute metabolic and toxic encephalopathy/concern for anoxic brain injury Patient has a history of neurosarcoidosis and was on infliximab.  Also on chronic steroids.  CT head done on 7/2 did not show any acute abnormalities.  Concerns for anoxic brain injury due to profound and prolonged hypoxemia at initial presentation.    Transaminitis Secondary to COVID-19.  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  Sodium level peaked at 168.    She was started on DDAVP.  Sodium level did improve.  Panhypopituitarism Diabetes mellitus type 2 History of obstructive sleep apnea Normocytic anemia    Pertinent Labs and Studies  Significant Diagnostic Studies Dg Abd 1 View  Result Date: 04/10/2019 CLINICAL DATA:  Nasogastric tube placement EXAM: ABDOMEN - 1 VIEW COMPARISON:  Plain film of the  abdomen dated 2019-07-08. FINDINGS: Nasogastric tube appears adequately positioned  in the stomach. Visualized bowel gas pattern is nonobstructive. IMPRESSION: Nasogastric tube appears adequately positioned in the stomach. Electronically Signed   By: Bary Richard M.D.   On: 04/10/2019 11:26   Ct Head Wo Contrast  Result Date: 04/06/2019 CLINICAL DATA:  48 y/o F; unexplained altered level of consciousness. Found unresponsive. Sepsis and COVID-19 positive. EXAM: CT HEAD WITHOUT CONTRAST TECHNIQUE: Contiguous axial images were obtained from the base of the skull through the vertex without intravenous contrast. COMPARISON:  None. FINDINGS: Brain: No evidence of acute infarction, hemorrhage, hydrocephalus, extra-axial collection or mass effect. Coarse calcifications are present which appear to be centered in the region of hypothalamus and mamillary bodies. Vascular: No hyperdense vessel or unexpected calcification. Skull: Normal. Negative for fracture or focal lesion. Sinuses/Orbits: Right maxillary sinus mucosal thickening. Large left maxillary sinus mucous retention cyst or polyp. Additional included paranasal sinuses and the mastoid air cells are normally aerated. Nasoenteric tube and endotracheal tube noted. Orbits are unremarkable. Other: None. IMPRESSION: 1. No acute intracranial abnormality identified. 2. Coarse calcifications which appears centered in the region of hypothalamus and mamillary bodies. Findings may represent a small underlying mass such as craniopharyngioma, glioma, and hamartoma or possibly sequelae of prior toxic/inflammatory process. Consider MRI of the brain without and with contrast to better characterize. Electronically Signed   By: Mitzi Hansen M.D.   On: 04/06/2019 02:24   Dg Chest Port 1 View  Result Date: 04/12/2019 CLINICAL DATA:  Endotracheal tube placement. EXAM: PORTABLE CHEST 1 VIEW COMPARISON:  Radiograph April 11, 2019. FINDINGS: Stable position of endotracheal and  nasogastric tubes. Right-sided PICC line is unchanged in position. No pneumothorax is noted. Stable bilateral lung opacities are noted consistent with pneumonia or edema. Small bilateral pleural effusions are noted. Bony thorax is unremarkable. Stable cardiomediastinal silhouette. IMPRESSION: Stable support apparatus. Stable bilateral lung opacities as described above. Electronically Signed   By: Lupita Raider M.D.   On: 04/12/2019 07:09   Dg Chest Port 1 View  Result Date: 04/11/2019 CLINICAL DATA:  Respiratory failure.  Hypoxia. EXAM: PORTABLE CHEST 1 VIEW COMPARISON:  04/10/2019. FINDINGS: Endotracheal tube, NG tube, right PICC line stable position. Stable cardiomegaly. Diffuse bilateral pulmonary infiltrates/edema again noted. Low lung volumes with progressive bibasilar atelectasis/consolidation. Left pleural effusion cannot be excluded. No pneumothorax. IMPRESSION: 1.  Lines and tubes in stable position. 2.  Stable cardiomegaly. 3. Diffuse bilateral pulmonary infiltrates/edema again noted. Low lung volumes with progressive bibasilar atelectasis/consolidation. Left pleural effusion cannot be excluded. Electronically Signed   By: Maisie Fus  Register   On: 04/11/2019 06:47   Dg Chest Port 1 View  Result Date: 04/10/2019 CLINICAL DATA:  ARDS. EXAM: PORTABLE CHEST 1 VIEW COMPARISON:  04/08/2019. FINDINGS: Interval removal of left IJ line. Endotracheal tube, NG tube, right PICC line stable position. Cardiomegaly. Diffuse bilateral pulmonary infiltrates/edema again noted. No prominent pleural effusion or pneumothorax. IMPRESSION: 1. Interval removal of left IJ line, remaining lines and tubes in stable position. No pneumothorax. 2. Stable cardiomegaly. Stable diffuse bilateral pulmonary interstitial infiltrates/edema again noted. Electronically Signed   By: Maisie Fus  Register   On: 04/10/2019 13:59   Dg Chest Port 1 View  Result Date: 04/08/2019 CLINICAL DATA:  Hypoxia. EXAM: PORTABLE CHEST 1 VIEW COMPARISON:   Radiograph same day. FINDINGS: Stable cardiomediastinal silhouette. Endotracheal and nasogastric tubes are unchanged in position. Left internal jugular catheter is unchanged in position. No pneumothorax is noted. Right-sided PICC line is noted with distal tip in expected position of cavoatrial junction. Stable diffuse lung opacities  are noted most consistent with pneumonia or edema. Small bilateral pleural effusions may be present. Bony thorax is unremarkable. IMPRESSION: Interval placement of right-sided PICC line with distal tip in expected position of cavoatrial junction. Otherwise stable support apparatus. Stable bilateral lung opacities are noted concerning for pneumonia or edema with possible small pleural effusions. Electronically Signed   By: Lupita RaiderJames  Green Jr M.D.   On: 04/08/2019 15:22   Dg Chest Port 1 View  Result Date: 04/08/2019 CLINICAL DATA:  Worsening oxygenation. EXAM: PORTABLE CHEST 1 VIEW COMPARISON:  Chest radiograph 04/06/2019 FINDINGS: ET tube mid trachea. Left IJ central venous catheter tip projects over the right atrium. Enteric tube side port terminates in the mid esophagus. Stable cardiomegaly. Monitoring leads overlie the patient. Similar-appearing diffuse bilateral airspace opacities. No large pleural effusion or pneumothorax. Thoracic spine degenerative changes. IMPRESSION: ET tube side-port projects in the midesophagus, recommend advancement. Otherwise similar-appearing bilateral diffuse airspace opacities. Electronically Signed   By: Annia Beltrew  Davis M.D.   On: 04/08/2019 11:41   Dg Chest Port 1 View  Result Date: 04/06/2019 CLINICAL DATA:  Shortness of breath. EXAM: PORTABLE CHEST 1 VIEW COMPARISON:  04/06/2019. FINDINGS: Endotracheal tube, NG tube, left IJ line stable position. Stable cardiomegaly. Diffuse severe bilateral pulmonary infiltrates/edema again noted. No prominent pleural effusion. No pneumothorax. IMPRESSION: 1.  Lines and tubes stable position. 2. Stable cardiomegaly.  Diffuse severe bilateral pulmonary infiltrates/edema again noted. No interim change. Electronically Signed   By: Maisie Fushomas  Register   On: 04/06/2019 08:42   Dg Chest Port 1 View  Result Date: 04/06/2019 CLINICAL DATA:  Respiratory failure.  COVID-19 positive. EXAM: PORTABLE CHEST 1 VIEW COMPARISON:  No recent prior. FINDINGS: Left IJ line noted with tip over cavoatrial junction. NG tube noted with tip below left hemidiaphragm. Cardiomegaly. Diffuse bilateral pulmonary infiltrates/edema. Tiny left effusion cannot be excluded. No pneumothorax. IMPRESSION: 1. Left IJ line noted with tip over cavoatrial junction. NG tube noted with tip below left hemidiaphragm. 2.  Cardiomegaly. 3. Severe diffuse bilateral pulmonary infiltrates/edema. Tiny left pleural effusion cannot be excluded. Electronically Signed   By: Maisie Fushomas  Register   On: 04/06/2019 07:36   Dg Abd Portable 1v  Result Date: 04/25/2019 CLINICAL DATA:  Check gastric catheter placement EXAM: PORTABLE ABDOMEN - 1 VIEW COMPARISON:  None. FINDINGS: Scattered large and small bowel gas is noted. Gastric catheter is noted within the distal aspect of the stomach. Patchy infiltrates are noted within the lungs bilaterally. IMPRESSION: Gastric catheter within the stomach. Electronically Signed   By: Alcide CleverMark  Lukens M.D.   On: 11/29/18 21:55   [place] Ekg Site Rite  Result Date: 04/07/2019 If Site Rite image not attached, placement could not be confirmed due to current cardiac rhythm.   Microbiology Recent Results (from the past 240 hour(s))  MRSA PCR Screening     Status: Abnormal   Collection Time: 04/06/19  2:45 AM   Specimen: Nasopharyngeal  Result Value Ref Range Status   MRSA by PCR POSITIVE (A) NEGATIVE Final    Comment:        The GeneXpert MRSA Assay (FDA approved for NASAL specimens only), is one component of a comprehensive MRSA colonization surveillance program. It is not intended to diagnose MRSA infection nor to guide or monitor treatment  for MRSA infections. RESULT CALLED TO, READ BACK BY AND VERIFIED WITH: Jaclyn PrimeHEATHER JOBE,RN [phone] @ 1419 BY J SCOTTON Performed at [facility], [address] [place], [place] [id number]   Culture, respiratory (non-expectorated)  Status: None   Collection Time: 04/06/19 11:36 AM   Specimen: Tracheal Aspirate; Respiratory  Result Value Ref Range Status   Specimen Description   Final    TRACHEAL ASPIRATE Performed at [facility] [address] [place], [place] [address]    Special Requests   Final    NONE Performed at [facility], [address], [place], [place] [address]    Gram Stain   Final    ABUNDANT WBC PRESENT,BOTH PMN AND MONONUCLEAR NO ORGANISMS SEEN Performed at [facility], [place] [address] [place], [place] [address]    Culture RARE METHICILLIN RESISTANT STAPHYLOCOCCUS AUREUS  Final   Report Status 04/09/2019 FINAL  Final   Organism ID, Bacteria METHICILLIN RESISTANT STAPHYLOCOCCUS AUREUS  Final      Susceptibility   Methicillin resistant staphylococcus aureus - MIC*    CIPROFLOXACIN >=8 RESISTANT Resistant     ERYTHROMYCIN >=8 RESISTANT Resistant     GENTAMICIN <=0.5 SENSITIVE Sensitive     OXACILLIN >=4 RESISTANT Resistant     TETRACYCLINE <=1 SENSITIVE Sensitive     VANCOMYCIN 1 SENSITIVE Sensitive     TRIMETH/SULFA <=10 SENSITIVE Sensitive     CLINDAMYCIN <=0.25 SENSITIVE Sensitive     RIFAMPIN <=0.5 SENSITIVE Sensitive     Inducible Clindamycin NEGATIVE Sensitive     * RARE METHICILLIN RESISTANT STAPHYLOCOCCUS AUREUS    Lab Basic Metabolic Panel: Recent Labs  Lab 04/07/19 0500  04/07/19 1719 04/08/19 0500  04/08/19 2222 04/09/19 0419  04/10/19 0450 04/10/19 1813 04/11/19 0516 04/11/19 0753 04/12/19 0535 11-May-2019 0501  NA 142   < >  --  152*   < > 155* 161*   < > 164* 160*  --  157* 150* 149*  K 3.7   < >  --  3.9   < > 3.5 3.4*   < > 3.3* 3.7  --  3.6 3.3* 3.2*  CL 110  --   --   118*  --  124* 128*  --  128*  --   --  121* 114* 104  CO2 24  --   --  28  --  26 26  --  31  --   --  27 33* 37*  GLUCOSE 415*  --   --  154*  --  120* 115*  --  88  --   --  150* 83 100*  BUN 21*  --   --  27*  --  31* 29*  --  25*  --   --  23* 24* 24*  CREATININE 0.78  --   --  0.76  --  0.86 0.84  --  0.76  --  0.74 0.66 0.56 0.61  CALCIUM 6.5*  --   --  7.4*  --  7.3* 7.7*  --  7.4*  --   --  7.3* 7.8* 7.9*  MG 1.8  --  2.3 2.5*  --  2.3 2.4  --  2.0  --   --   --   --  1.6*  PHOS 1.1*  --  1.0* 1.7*  --   --  1.2*  --  2.8  --   --   --   --   --    < > = values in this interval not displayed.   Liver Function Tests: Recent Labs  Lab 04/09/19 0419 04/10/19 0450 04/11/19 0753 04/12/19 0535 2019/05/11 0501  AST 51* 37 28 25 45*  ALT 31 27 26 24  32  ALKPHOS 64 62 55 53 248*  BILITOT 0.3 0.6 0.3 0.4 0.2*  PROT 6.3* 6.1* 5.3* 6.0* 5.9*  ALBUMIN 2.2* 2.1* 1.9* 2.2* 2.2*   CBC: Recent Labs  Lab 04/07/19 0500  04/08/19 0500  04/09/19 0419  04/10/19 0450 04/10/19 1450 04/10/19 1813 04/11/19 0753 04/12/19 0535 02/15/19 0501  WBC 9.6  --  8.2  --  7.4  --  7.9 10.3  --  8.2 9.7 7.5  NEUTROABS 8.1*  --  6.6  --  5.2  --  5.0  --   --   --   --   --   HGB 9.5*   < > 10.2*   < > 10.3*   < > 10.5* 10.0* 11.2* 10.4* 9.7* 9.7*  HCT 35.9*   < > 38.5   < > 37.4   < > 38.8 36.1 33.0* 37.7 35.2* 34.4*  MCV 95.7  --  94.6  --  93.7  --  94.4 94.3  --  94.7 93.9 92.5  PLT 152  --  171  --  165  --  215 230  --  217 213 202   < > = values in this interval not displayed.   Sepsis Labs: Recent Labs  Lab 04/07/19 0500 04/08/19 0500 04/09/19 0419  04/10/19 1450 04/11/19 0753 04/12/19 0535 02/15/19 0501  PROCALCITON 1.23 0.68 0.50  --   --   --   --   --   WBC 9.6 8.2 7.4   < > 10.3 8.2 9.7 7.5   < > = values in this interval not displayed.     Osvaldo ShipperGokul Lasya Vetter 04/20/2019, 4:51 PM

## 2019-05-06 NOTE — Progress Notes (Signed)
[facility]  I spoke to the patient's family at length today regarding Ms. Christine Patterson's condition.  I spoke to her son Christine Patterson and yesterday I spoke to Christine Patterson her daughter.  Christine Patterson tells me that he and his sister and his aunt Christine Patterson feel that the best approach is to withdraw care and focus on their mother's comfort only at this point.  They tell me they will update the rest of the family.  They request an opportunity to FaceTime prior to witdhrawal of care.  Christine Awkward, MD [facility] Pager: [phone] Cell: [phone] If no response, call [phone]

## 2019-05-06 NOTE — Progress Notes (Signed)
Facetime and phone calls done with patient's family, daughter, son, and niece.

## 2019-05-06 NOTE — Progress Notes (Signed)
Post mortem care done on patient. Patient's daughter called near the end on care being provided, informed of time that patient passed. Patient stated, "I will have to call you back", tearful. RN attempted to call patient's son with no answer.Patient's daughter called back, questions answered. Name of preferred funeral home given: [facility] [address] [place],  [address] [phone].Condolensces given to daughter and reassured her that she was not alone, RN and RT were at bedside with her the whole time.

## 2019-05-06 DEATH — deceased

## 2020-11-27 IMAGING — DX PORTABLE CHEST - 1 VIEW
1 series · 1 of 1 positions shown · non-contrast
Comparison: 04/06/2019.

CLINICAL DATA: Shortness of breath.

EXAM:
PORTABLE CHEST 1 VIEW

[chest ap]
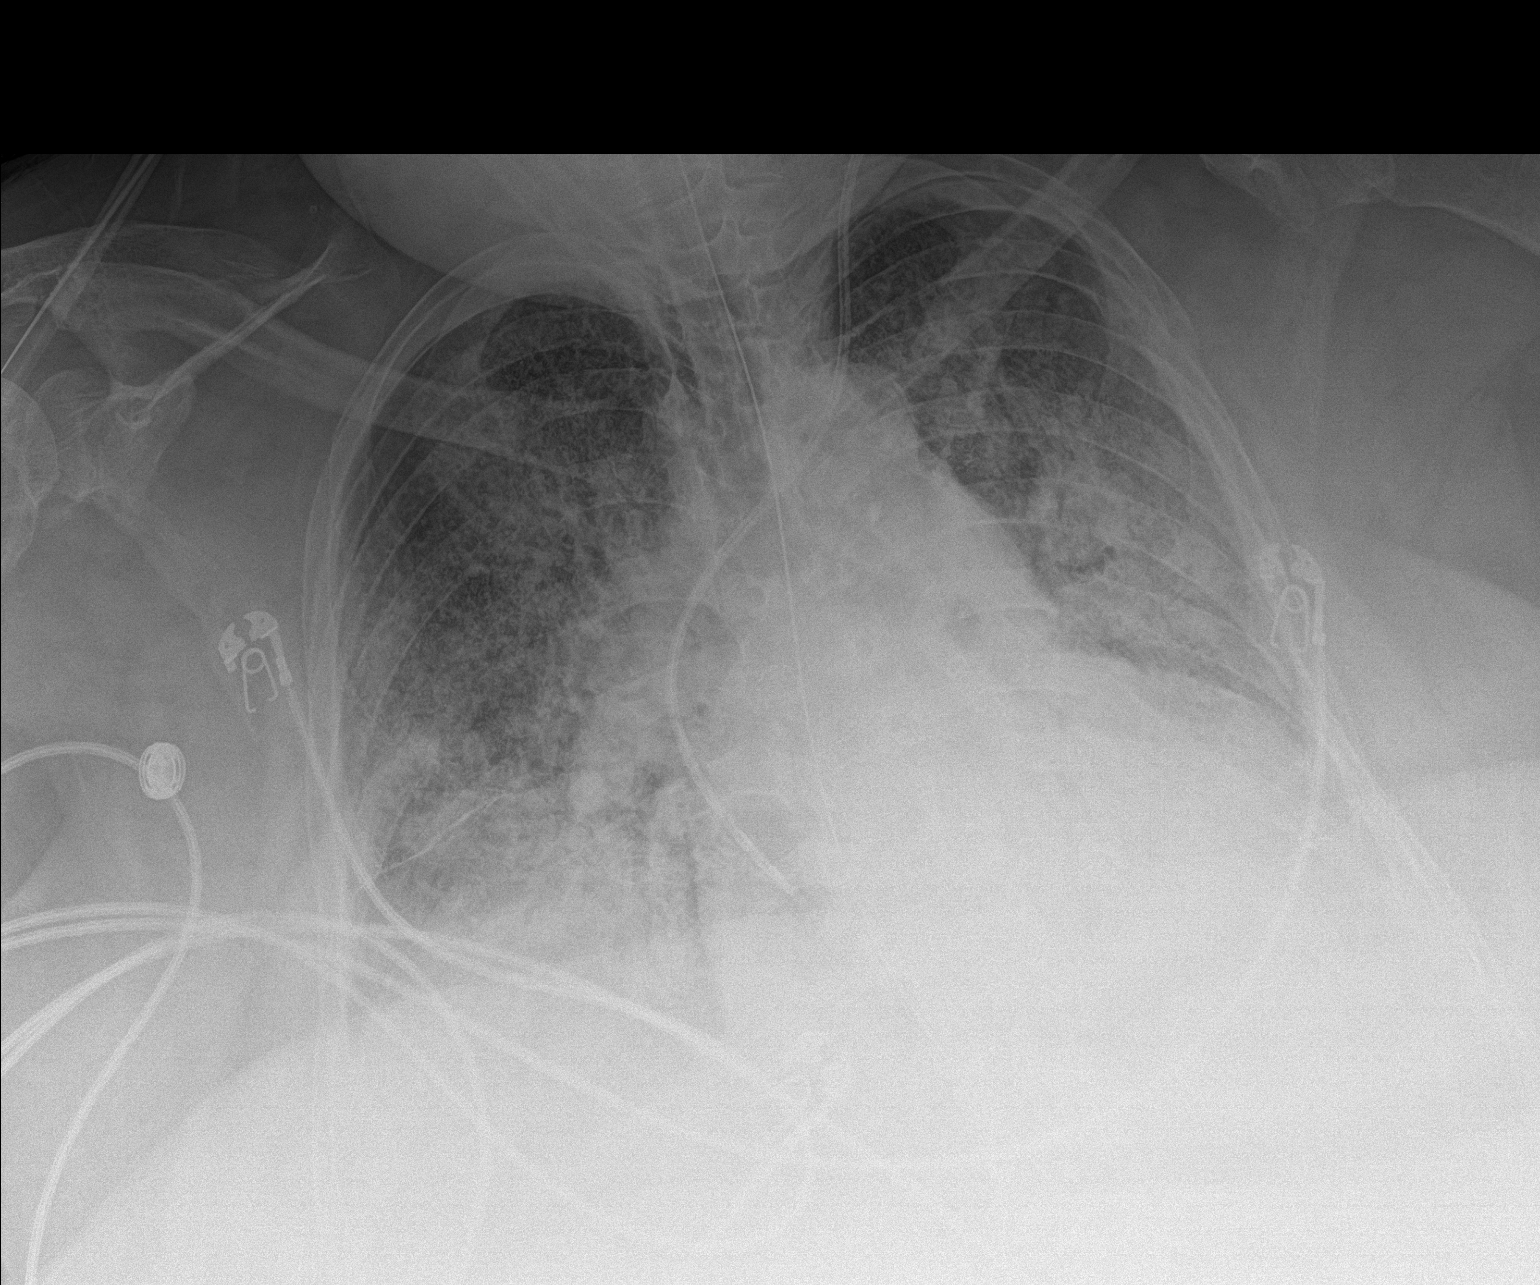

[1 of 1 positions shown; findings below may reference images not displayed]

FINDINGS: Endotracheal tube, NG tube, left IJ line stable position. Stable
cardiomegaly. Diffuse severe bilateral pulmonary infiltrates/edema
again noted. No prominent pleural effusion. No pneumothorax.
IMPRESSION: 1.  Lines and tubes stable position.

2. Stable cardiomegaly. Diffuse severe bilateral pulmonary
infiltrates/edema again noted. No interim change.

## 2020-12-03 IMAGING — DX PORTABLE CHEST - 1 VIEW
1 series · 1 of 1 positions shown · non-contrast
Comparison: Radiograph April 11, 2019.

CLINICAL DATA: Endotracheal tube placement.

EXAM:
PORTABLE CHEST 1 VIEW

[chest ap]
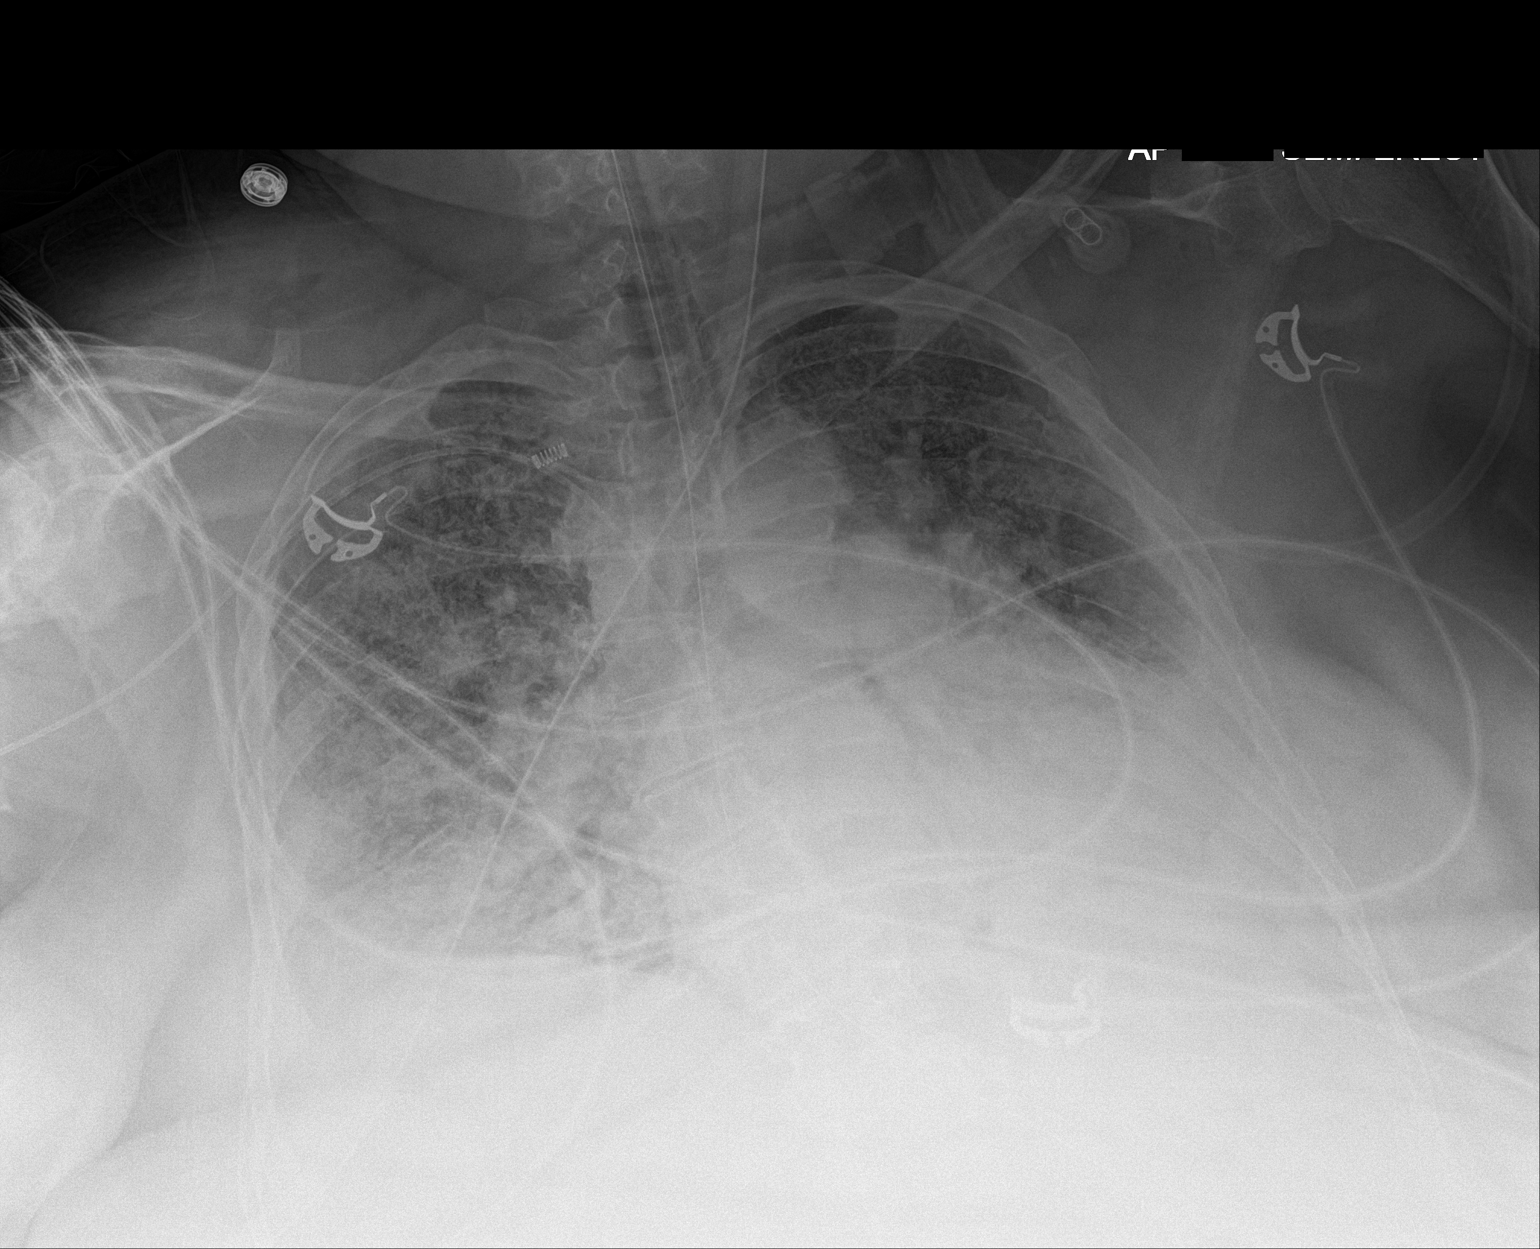

[1 of 1 positions shown; findings below may reference images not displayed]

FINDINGS: Stable position of endotracheal and nasogastric tubes. Right-sided
PICC line is unchanged in position. No pneumothorax is noted. Stable
bilateral lung opacities are noted consistent with pneumonia or
edema. Small bilateral pleural effusions are noted. Bony thorax is
unremarkable. Stable cardiomediastinal silhouette.
IMPRESSION: Stable support apparatus. Stable bilateral lung opacities as
described above.
